# Patient Record
Sex: Male | Born: 1975 | Race: White | Hispanic: No | Marital: Married | Smoking: Never smoker
Health system: Southern US, Community
[De-identification: ages and names within clinical notes are randomized; demographics above are authoritative.]

## PROBLEM LIST (undated history)

## (undated) DIAGNOSIS — I10 Essential (primary) hypertension: Secondary | ICD-10-CM

## (undated) DIAGNOSIS — E7800 Pure hypercholesterolemia, unspecified: Secondary | ICD-10-CM

## (undated) HISTORY — PX: NO PAST SURGERIES: SHX2092

## (undated) HISTORY — DX: Essential (primary) hypertension: I10

---

## 2010-04-16 ENCOUNTER — Emergency Department (HOSPITAL_COMMUNITY): Admission: EM | Admit: 2010-04-16 | Discharge: 2010-04-16 | Payer: Self-pay | Admitting: Emergency Medicine

## 2010-04-28 ENCOUNTER — Emergency Department (HOSPITAL_COMMUNITY): Admission: EM | Admit: 2010-04-28 | Discharge: 2010-04-29 | Payer: Self-pay | Admitting: Internal Medicine

## 2011-03-19 LAB — CBC
Hemoglobin: 15.9 g/dL (ref 13.0–17.0)
MCHC: 35.1 g/dL (ref 30.0–36.0)
MCV: 92 fL (ref 78.0–100.0)
RBC: 4.94 MIL/uL (ref 4.22–5.81)
RDW: 12.7 % (ref 11.5–15.5)
WBC: 8.1 10*3/uL (ref 4.0–10.5)

## 2011-03-19 LAB — URINE MICROSCOPIC-ADD ON

## 2011-03-19 LAB — COMPREHENSIVE METABOLIC PANEL
Alkaline Phosphatase: 130 U/L — ABNORMAL HIGH (ref 39–117)
GFR calc Af Amer: >60 mL/min (ref >60)
Potassium: 4.1 mEq/L (ref 3.5–5.1)
Total Bilirubin: 1.5 mg/dL — ABNORMAL HIGH (ref 0.3–1.2)

## 2011-03-19 LAB — DIFFERENTIAL
Eosinophils Relative: 0 % (ref 0–5)
Lymphocytes Relative: 9 % — ABNORMAL LOW (ref 12–46)
Lymphs Abs: 0.7 10*3/uL (ref 0.7–4.0)
Neutro Abs: 6.4 10*3/uL (ref 1.7–7.7)
Neutrophils Relative %: 79 % — ABNORMAL HIGH (ref 43–77)

## 2011-03-19 LAB — URINALYSIS, ROUTINE W REFLEX MICROSCOPIC
Glucose, UA: >1000 mg/dL — AB
Protein, ur: NEGATIVE mg/dL
Specific Gravity, Urine: 1.028 (ref 1.005–1.030)
Urobilinogen, UA: 4 mg/dL — ABNORMAL HIGH (ref 0.0–1.0)

## 2016-02-20 ENCOUNTER — Emergency Department (HOSPITAL_COMMUNITY)
Admission: EM | Admit: 2016-02-20 | Discharge: 2016-02-20 | Disposition: A | Discharge status: Home / Self Care | Payer: BLUE CROSS/BLUE SHIELD | Attending: Emergency Medicine | Admitting: Emergency Medicine

## 2016-02-20 ENCOUNTER — Encounter (HOSPITAL_COMMUNITY): Payer: Self-pay

## 2016-02-20 DIAGNOSIS — K922 Gastrointestinal hemorrhage, unspecified: Secondary | ICD-10-CM | POA: Diagnosis present

## 2016-02-20 DIAGNOSIS — R197 Diarrhea, unspecified: Secondary | ICD-10-CM | POA: Diagnosis not present

## 2016-02-20 LAB — COMPREHENSIVE METABOLIC PANEL
ALT: 34 U/L (ref 17–63)
AST: 29 U/L (ref 15–41)
Albumin: 4.4 g/dL (ref 3.5–5.0)
Alkaline Phosphatase: 71 U/L (ref 38–126)
Anion gap: 7 (ref 5–15)
BUN: 19 mg/dL (ref 6–20)
CHLORIDE: 106 mmol/L (ref 101–111)
CO2: 29 mmol/L (ref 22–32)
CREATININE: 0.93 mg/dL (ref 0.61–1.24)
Calcium: 9 mg/dL (ref 8.9–10.3)
GFR calc non Af Amer: >60 mL/min (ref >60)
Glucose, Bld: 93 mg/dL (ref 65–99)
Potassium: 4.4 mmol/L (ref 3.5–5.1)
SODIUM: 142 mmol/L (ref 135–145)
Total Bilirubin: 0.7 mg/dL (ref 0.3–1.2)
Total Protein: 7.7 g/dL (ref 6.5–8.1)

## 2016-02-20 LAB — CBC WITH DIFFERENTIAL/PLATELET
Basophils Absolute: 0 10*3/uL (ref 0.0–0.1)
Basophils Relative: 0 %
EOS ABS: 0.2 10*3/uL (ref 0.0–0.7)
Eosinophils Relative: 2 %
HEMATOCRIT: 46.9 % (ref 39.0–52.0)
HEMOGLOBIN: 16.5 g/dL (ref 13.0–17.0)
LYMPHS ABS: 1.7 10*3/uL (ref 0.7–4.0)
LYMPHS PCT: 21 %
MCH: 31.6 pg (ref 26.0–34.0)
MCHC: 35.2 g/dL (ref 30.0–36.0)
MCV: 89.8 fL (ref 78.0–100.0)
MONOS PCT: 13 %
Monocytes Absolute: 1.1 10*3/uL — ABNORMAL HIGH (ref 0.1–1.0)
NEUTROS ABS: 5.2 10*3/uL (ref 1.7–7.7)
NEUTROS PCT: 64 %
PLATELETS: 259 10*3/uL (ref 150–400)
RBC: 5.22 MIL/uL (ref 4.22–5.81)
RDW: 12.5 % (ref 11.5–15.5)
WBC: 8.2 10*3/uL (ref 4.0–10.5)

## 2016-02-20 NOTE — ED Provider Notes (Signed)
CSN: [id number]     Arrival date & time 02/20/16  1459 History   First MD Initiated Contact with Patient 02/20/16 1820     Chief Complaint  Patient presents with  . GI Bleeding     Patient is a 40 y.o. male presenting with diarrhea. The history is provided by the patient and a significant other.  Diarrhea Quality:  Watery Severity:  Moderate Onset quality:  Gradual Number of episodes:  Mutliple Timing:  Intermittent Progression:  Unchanged Relieved by:  None tried Worsened by:  Nothing tried Associated symptoms: abdominal pain   Associated symptoms: no fever and no vomiting   Risk factors: no recent antibiotic use and no travel to endemic areas   pt with recent bout of diarrhea-  He reports multiple episodes that were watery/yellow He took pepto bismol last night, and today had an episode of diarrhea that was black, but no blood He has never had this before He does take NSAIDs on a regular basis  PMH - none Soc hx - no travel Social History  Substance Use Topics  . Smoking status: Never Smoker   . Smokeless tobacco: None  . Alcohol Use: Yes     Comment: occ    Review of Systems  Constitutional: Negative for fever.  Gastrointestinal: Positive for abdominal pain and diarrhea. Negative for vomiting.  All other systems reviewed and are negative.     Allergies  Review of patient's allergies indicates no known allergies.  Home Medications   Prior to Admission medications   Not on File   BP 140/98 mmHg  Pulse 84  Temp(Src) 97.8 F (36.6 C) (Oral)  Resp 16  Ht  (1.626 m)  Wt 86.183 kg  BMI 32.60 kg/m2  SpO2 100% Physical Exam CONSTITUTIONAL: Well developed/well nourished HEAD: Normocephalic/atraumatic EYES: EOMI/PERRL, conjunctiva pink, no icterus ENMT: Mucous membranes moist NECK: supple no meningeal signs SPINE/BACK:entire spine nontender CV: S1/S2 noted, no murmurs/rubs/gallops noted LUNGS: Lungs are clear to auscultation bilaterally, no apparent  distress ABDOMEN: soft, nontender, no rebound or guarding, bowel sounds noted throughout abdomen Rectal - pt declines NEURO: Pt is awake/alert/appropriate, moves all extremitiesx4.  No facial droop.   SKIN: warm, color normal PSYCH: no abnormalities of mood noted, alert and oriented to situation  ED Course  Procedures  Labs Review Labs Reviewed  CBC WITH DIFFERENTIAL/PLATELET - Abnormal; Notable for the following:    Monocytes Absolute 1.1 (*)    All other components within normal limits  COMPREHENSIVE METABOLIC PANEL    I have personally reviewed and evaluated these  lab results as part of my medical decision-making.   Pt with probable viral diarrhea Suspect dark stool from pepto He declines rectal exam PCP f/u next week advised if diarrhea continues MDM   Final diagnoses:  Diarrhea, unspecified type    Nursing notes including past medical history and social history reviewed and considered in documentation Labs/vital reviewed myself and considered during evaluation     Zadie Rhine, MD 02/20/16 1901

## 2016-02-20 NOTE — ED Notes (Signed)
Pt reports woke up Sunday morning and had several episodes of diarrhea.  Reports had some diarrhea yesterday and nauseated.  This morning he drank some coffee,  ate breakfast and immediately had to go to the restroom.  Pt says had another episode of diarrhea this morning but stool was black.  Pt says he took 30ml of pepto bismol last night.

## 2016-12-03 DIAGNOSIS — Z6833 Body mass index (BMI) 33.0-33.9, adult: Secondary | ICD-10-CM | POA: Diagnosis not present

## 2016-12-03 DIAGNOSIS — M25532 Pain in left wrist: Secondary | ICD-10-CM | POA: Diagnosis not present

## 2017-01-24 DIAGNOSIS — H9201 Otalgia, right ear: Secondary | ICD-10-CM | POA: Diagnosis not present

## 2017-12-11 DIAGNOSIS — Z6834 Body mass index (BMI) 34.0-34.9, adult: Secondary | ICD-10-CM | POA: Diagnosis not present

## 2017-12-11 DIAGNOSIS — J09X2 Influenza due to identified novel influenza A virus with other respiratory manifestations: Secondary | ICD-10-CM | POA: Diagnosis not present

## 2018-03-09 DIAGNOSIS — J019 Acute sinusitis, unspecified: Secondary | ICD-10-CM | POA: Diagnosis not present

## 2018-03-09 DIAGNOSIS — J069 Acute upper respiratory infection, unspecified: Secondary | ICD-10-CM | POA: Diagnosis not present

## 2018-03-09 DIAGNOSIS — Z681 Body mass index (BMI) 19 or less, adult: Secondary | ICD-10-CM | POA: Diagnosis not present

## 2018-10-18 ENCOUNTER — Emergency Department (HOSPITAL_COMMUNITY): Payer: BLUE CROSS/BLUE SHIELD

## 2018-10-18 ENCOUNTER — Encounter (HOSPITAL_COMMUNITY): Payer: Self-pay | Admitting: Emergency Medicine

## 2018-10-18 ENCOUNTER — Other Ambulatory Visit: Payer: Self-pay

## 2018-10-18 ENCOUNTER — Emergency Department (HOSPITAL_COMMUNITY)
Admission: EM | Admit: 2018-10-18 | Discharge: 2018-10-18 | Disposition: A | Discharge status: Home / Self Care | Payer: BLUE CROSS/BLUE SHIELD | Attending: Emergency Medicine | Admitting: Emergency Medicine

## 2018-10-18 DIAGNOSIS — E7800 Pure hypercholesterolemia, unspecified: Secondary | ICD-10-CM | POA: Diagnosis not present

## 2018-10-18 DIAGNOSIS — R072 Precordial pain: Secondary | ICD-10-CM | POA: Diagnosis not present

## 2018-10-18 DIAGNOSIS — R079 Chest pain, unspecified: Secondary | ICD-10-CM | POA: Diagnosis not present

## 2018-10-18 DIAGNOSIS — R0789 Other chest pain: Secondary | ICD-10-CM

## 2018-10-18 HISTORY — DX: Pure hypercholesterolemia, unspecified: E78.00

## 2018-10-18 LAB — CBC
HCT: 49.9 % (ref 39.0–52.0)
Hemoglobin: 16.6 g/dL (ref 13.0–17.0)
MCH: 30.3 pg (ref 26.0–34.0)
MCHC: 33.3 g/dL (ref 30.0–36.0)
MCV: 91.2 fL (ref 80.0–100.0)
PLATELETS: 314 10*3/uL (ref 150–400)
RBC: 5.47 MIL/uL (ref 4.22–5.81)
RDW: 12.2 % (ref 11.5–15.5)
WBC: 8.6 10*3/uL (ref 4.0–10.5)
nRBC: 0 % (ref 0.0–0.2)

## 2018-10-18 LAB — BASIC METABOLIC PANEL
ANION GAP: 6 (ref 5–15)
BUN: 14 mg/dL (ref 6–20)
CALCIUM: 9.4 mg/dL (ref 8.9–10.3)
CO2: 27 mmol/L (ref 22–32)
CREATININE: 0.94 mg/dL (ref 0.61–1.24)
Chloride: 107 mmol/L (ref 98–111)
GFR calc Af Amer: >60 mL/min (ref >60)
Glucose, Bld: 75 mg/dL (ref 70–99)
Potassium: 4.2 mmol/L (ref 3.5–5.1)
Sodium: 140 mmol/L (ref 135–145)

## 2018-10-18 LAB — TROPONIN I: Troponin I: <0.03 ng/mL (ref <0.03)

## 2018-10-18 NOTE — ED Triage Notes (Signed)
Patient c/o mid-sternal chest pain, non-radiating and intermittent. Patient states started after eating. Patient reports shortness of breath and feeling flushed. Denies any nausea, vomiting, dizziness, or back pain. Denies any cardiac hx.

## 2018-10-18 NOTE — Discharge Instructions (Signed)
Your testing today revealed no abnormal findings, your blood work was perfectly normal as was your EKG.  Please follow-up with the cardiologist if you continue to have intermittent pain but if it gets worse come back to the emergency department.  If you do not have a doctor see list below for local family doctors, follow-up this week  [facility] Doctor List    Kari Baars MD. Specialty: Pulmonary Disease Contact information: [address]  [address]  [place] [address]  [phone]   Syliva Overman, MD. Specialty: [facility] Contact information: [address]  [place] [address]  [phone]   Lilyan Punt, MD. Specialty: [facility] Contact information: [address]  [place] [address]  [phone]   Avon Gully, MD Specialty: Internal Medicine Contact information: [address] [place] [address]  [phone]   Catalina Pizza, MD. Specialty: Internal Medicine Contact information: [address]  [place] [address]  [phone]    [facility] (Dr. Selena Batten) Specialty: Family Medicine Contact information: [address]  [place] [address]  [phone]   John Giovanni, MD. Specialty: [facility] Contact information: [address]  [address]  [place] [address]  [phone]   Carylon Perches, MD. Specialty: Internal Medicine Contact information: [address]  [address]  [place] [address]  [phone]    [facility]  [address] [place], [place] [address] [phone]  Services [facility] offers a variety of basic health services.  Services include but are not limited to: Blood pressure checks  Heart rate checks  Blood sugar checks  Urine analysis  Rapid strep tests  Pregnancy tests.  Health education and referrals  People needing more complex services will be directed to a  physician online. Using these virtual visits, doctors can evaluate and prescribe medicine and treatments. There will be no medication on-site, though [facility] will help patients fill their prescriptions at little to no cost.   For More information please go to: [email or web address]

## 2018-10-18 NOTE — ED Provider Notes (Signed)
[facility] EMERGENCY DEPARTMENT Provider Note   CSN: [id number] Arrival date & time: 10/18/18  1208     History   Chief Complaint Chief Complaint  Patient presents with  . Chest Pain    HPI Vincent George is a 42 y.o. male.  HPI  42 year old male, he has no significant past medical history, states he has never been diagnosed with any medical problems and he takes no medications.  He does not smoke cigarettes and drinks only occasional alcohol.  His family history is negative for heart disease except for a grandfather who had heart disease in his mid to late 20s.  The patient reports that he works in a warehouse type setting, he exerts himself regularly at work, he does not have any frequent chest discomfort.  He reports that he woke up this morning feeling normal, they went to breakfast, he and his wife were sitting down eating breakfast when he had a discomfort in his chest with associated shortness of breath.  He excused himself from the table, went outside to get some fresh air, walked around and came back symptom-free.  Finished his meal, was driving in the car with his wife and the symptoms came back, similar in sensation, mid chest, the patient denies any shortness of breath swelling of the legs recent travel, trauma, immobilization, surgery or history of cancer.  He states this is occurred twice today, the symptoms last for 2 to 3 minutes and then resolved spontaneously, they are not exertional.  Past Medical History:  Diagnosis Date  . High cholesterol     There are no active problems to display for this patient.   History reviewed. No pertinent surgical history.      Home Medications    Prior to Admission medications   Medication Sig Start Date End Date Taking? Authorizing Provider  acetaminophen (TYLENOL) 500 MG tablet Take 1,000 mg by mouth every 6 (six) hours as needed for headache.   Yes [provider]    Family History No family history on  file.  Social History Social History   Tobacco Use  . Smoking status: Never Smoker  . Smokeless tobacco: Never Used  Substance Use Topics  . Alcohol use: Yes    Comment: occ  . Drug use: No     Allergies   Patient has no known allergies.   Review of Systems Review of Systems  Constitutional: Negative for chills and fever.  HENT: Negative for sore throat.   Eyes: Negative for visual disturbance.  Respiratory: Negative for cough and shortness of breath.   Cardiovascular: Positive for chest pain.  Gastrointestinal: Negative for abdominal pain, diarrhea, nausea and vomiting.  Genitourinary: Negative for dysuria and frequency.  Musculoskeletal: Negative for back pain and neck pain.  Skin: Negative for rash.  Neurological: Negative for weakness, numbness and headaches.  Hematological: Negative for adenopathy.  Psychiatric/Behavioral: Negative for behavioral problems.  All other systems reviewed and are negative.    Physical Exam Updated Vital Signs BP 125/84   Pulse 79   Temp 98.1 F (36.7 C) (Oral)   Resp (!) 23   Ht 1.626 m (5\' 4" )   Wt 81.6 kg   SpO2 98%   BMI 30.90 kg/m   Physical Exam  Constitutional: He appears well-developed and well-nourished. No distress.  HENT:  Head: Normocephalic and atraumatic.  Mouth/Throat: Oropharynx is clear and moist. No oropharyngeal exudate.  Eyes: Pupils are equal, round, and reactive to light. Conjunctivae and EOM are normal. Right eye  exhibits no discharge. Left eye exhibits no discharge. No scleral icterus.  Neck: Normal range of motion. Neck supple. No JVD present. No thyromegaly present.  Cardiovascular: Normal rate, regular rhythm, normal heart sounds and intact distal pulses. Exam reveals no gallop and no friction rub.  No murmur heard. Pulmonary/Chest: Effort normal and breath sounds normal. No respiratory distress. He has no wheezes. He has no rales.  Abdominal: Soft. Bowel sounds are normal. He exhibits no  distension and no mass. There is no tenderness.  Musculoskeletal: Normal range of motion. He exhibits no edema or tenderness.  Lymphadenopathy:    He has no cervical adenopathy.  Neurological: He is alert. Coordination normal.  Skin: Skin is warm and dry. No rash noted. No erythema.  Psychiatric: He has a normal mood and affect. His behavior is normal.  Nursing note and vitals reviewed.    ED Treatments / Results  Labs (all labs ordered are listed, but only abnormal results are displayed) Labs Reviewed  BASIC METABOLIC PANEL  CBC  TROPONIN I  TROPONIN I    EKG EKG Interpretation  Date/Time:  Sunday October 18 2018 12:18:04 EDT Ventricular Rate:  74 PR Interval:    QRS Duration: 91 QT Interval:  346 QTC Calculation: 384 R Axis:   22 Text Interpretation:  Sinus rhythm Normal ECG No old tracing to compare Confirmed by Eber Hong ([id number]) on 10/18/2018 12:32:41 PM   EKG Interpretation  Date/Time:  Sunday October 18 2018 15:22:57 EDT Ventricular Rate:  86 PR Interval:    QRS Duration: 92 QT Interval:  353 QTC Calculation: 423 R Axis:   29 Text Interpretation:  Sinus rhythm since last tracing no significant change Confirmed by Eber Hong ([id number]) on 10/18/2018 3:43:05 PM        Radiology Dg Chest 2 View  Result Date: 10/18/2018 CLINICAL DATA:  Chest pain this morning with some shortness-of-breath. EXAM: CHEST - 2 VIEW COMPARISON:  CT 04/29/2010 FINDINGS: Lungs are adequately inflated without consolidation or effusion. Cardiomediastinal silhouette is within normal. Mild anterior wedging of 2 adjacent lower thoracic vertebral bodies unchanged. IMPRESSION: No acute cardiopulmonary disease. Two adjacent mild compression fractures over the lower thoracic/upper lumbar spine unchanged. Electronically Signed   By: Elberta Fortis M.D.   On: 10/18/2018 13:20    Procedures Procedures (including critical care time)  Medications Ordered in ED Medications - No data to  display   Initial Impression / Assessment and Plan / ED Course  I have reviewed the triage vital signs and the nursing notes.  Pertinent labs & imaging results that were available during my care of the patient were reviewed by me and considered in my medical decision making (see chart for details).  Clinical Course as of Oct 18 1633  Wynelle Link Oct 18, 2018  1342 Initial troponin basic metabolic panel and CBC are totally normal.   [BM]  1342 The x-ray which I personally interpreted and reviewed shows no acute cardiopulmonary findings.   [BM]  1543 Repeat EKG shows no acute findings, no changes.  Second troponin pending   [BM]  1622 Second trop neg - pt will be d/c.   [BM]    Clinical Course User Index [BM] Eber Hong, MD    The only significant abnormality is that the patient's blood pressure was elevated, when I rechecked it it was 149/105.  The rest of his exam is normal and he is asymptomatic at this time.  Specifically his EKG is reassuring, it shows a rate of 74 and  a normal rhythm with no ST elevations or depressions.  Will obtain a troponin and a chest x-ray, will also obtain a second troponin III hours.  I anticipate that the patient can be discharged home if this is negative.  He has no fevers or shortness of breath or coughing to suggest an infectious etiology and is asymptomatic at this time making dissection or pulmonary embolism or pericarditis much less likely.  Final Clinical Impressions(s) / ED Diagnoses   Final diagnoses:  Midsternal chest pain    ED Discharge Orders    None       Eber Hong, MD 10/18/18 [phone]

## 2019-02-12 DIAGNOSIS — K429 Umbilical hernia without obstruction or gangrene: Secondary | ICD-10-CM | POA: Diagnosis not present

## 2019-02-12 DIAGNOSIS — Z6834 Body mass index (BMI) 34.0-34.9, adult: Secondary | ICD-10-CM | POA: Diagnosis not present

## 2019-02-12 DIAGNOSIS — S39011A Strain of muscle, fascia and tendon of abdomen, initial encounter: Secondary | ICD-10-CM | POA: Diagnosis not present

## 2019-03-03 DIAGNOSIS — Z6834 Body mass index (BMI) 34.0-34.9, adult: Secondary | ICD-10-CM | POA: Diagnosis not present

## 2019-03-03 DIAGNOSIS — R1011 Right upper quadrant pain: Secondary | ICD-10-CM | POA: Diagnosis not present

## 2019-03-04 ENCOUNTER — Other Ambulatory Visit (HOSPITAL_COMMUNITY): Payer: Self-pay | Admitting: Physician Assistant

## 2019-03-04 DIAGNOSIS — R1011 Right upper quadrant pain: Secondary | ICD-10-CM

## 2019-03-10 ENCOUNTER — Ambulatory Visit (HOSPITAL_COMMUNITY): Payer: BLUE CROSS/BLUE SHIELD

## 2019-03-10 ENCOUNTER — Encounter (HOSPITAL_COMMUNITY): Payer: Self-pay

## 2019-03-15 ENCOUNTER — Ambulatory Visit (HOSPITAL_COMMUNITY): Payer: BLUE CROSS/BLUE SHIELD

## 2019-03-19 ENCOUNTER — Ambulatory Visit (HOSPITAL_COMMUNITY): Payer: BLUE CROSS/BLUE SHIELD

## 2019-07-31 DIAGNOSIS — Z6836 Body mass index (BMI) 36.0-36.9, adult: Secondary | ICD-10-CM | POA: Diagnosis not present

## 2019-07-31 DIAGNOSIS — L0390 Cellulitis, unspecified: Secondary | ICD-10-CM | POA: Diagnosis not present

## 2020-02-02 DIAGNOSIS — Z20828 Contact with and (suspected) exposure to other viral communicable diseases: Secondary | ICD-10-CM | POA: Diagnosis not present

## 2020-06-05 DIAGNOSIS — Z20828 Contact with and (suspected) exposure to other viral communicable diseases: Secondary | ICD-10-CM | POA: Diagnosis not present

## 2020-06-29 DIAGNOSIS — R05 Cough: Secondary | ICD-10-CM | POA: Diagnosis not present

## 2020-06-29 DIAGNOSIS — J069 Acute upper respiratory infection, unspecified: Secondary | ICD-10-CM | POA: Diagnosis not present

## 2020-11-21 DIAGNOSIS — Z6836 Body mass index (BMI) 36.0-36.9, adult: Secondary | ICD-10-CM | POA: Diagnosis not present

## 2020-11-21 DIAGNOSIS — R5383 Other fatigue: Secondary | ICD-10-CM | POA: Diagnosis not present

## 2020-11-21 DIAGNOSIS — Z8616 Personal history of COVID-19: Secondary | ICD-10-CM | POA: Diagnosis not present

## 2021-12-19 ENCOUNTER — Encounter (HOSPITAL_COMMUNITY): Payer: Self-pay | Admitting: Emergency Medicine

## 2021-12-19 ENCOUNTER — Emergency Department (HOSPITAL_COMMUNITY): Payer: BC Managed Care – PPO

## 2021-12-19 ENCOUNTER — Emergency Department (HOSPITAL_COMMUNITY)
Admission: EM | Admit: 2021-12-19 | Discharge: 2021-12-19 | Disposition: A | Discharge status: Home / Self Care | Payer: BC Managed Care – PPO | Attending: Emergency Medicine | Admitting: Emergency Medicine

## 2021-12-19 ENCOUNTER — Other Ambulatory Visit: Payer: Self-pay

## 2021-12-19 DIAGNOSIS — R0789 Other chest pain: Secondary | ICD-10-CM | POA: Diagnosis present

## 2021-12-19 DIAGNOSIS — R079 Chest pain, unspecified: Secondary | ICD-10-CM

## 2021-12-19 DIAGNOSIS — I160 Hypertensive urgency: Secondary | ICD-10-CM | POA: Diagnosis not present

## 2021-12-19 LAB — CBC WITH DIFFERENTIAL/PLATELET
Abs Immature Granulocytes: 0.01 10*3/uL (ref 0.00–0.07)
Basophils Absolute: 0.1 10*3/uL (ref 0.0–0.1)
Basophils Relative: 1 %
Eosinophils Absolute: 0.2 10*3/uL (ref 0.0–0.5)
Eosinophils Relative: 2 %
HCT: 45.7 % (ref 39.0–52.0)
Hemoglobin: 16.2 g/dL (ref 13.0–17.0)
Immature Granulocytes: 0 %
Lymphocytes Relative: 28 %
Lymphs Abs: 2.3 10*3/uL (ref 0.7–4.0)
MCH: 32.3 pg (ref 26.0–34.0)
MCHC: 35.4 g/dL (ref 30.0–36.0)
MCV: 91 fL (ref 80.0–100.0)
Monocytes Absolute: 0.8 10*3/uL (ref 0.1–1.0)
Monocytes Relative: 10 %
Neutro Abs: 4.8 10*3/uL (ref 1.7–7.7)
Neutrophils Relative %: 59 %
Platelets: 306 10*3/uL (ref 150–400)
RBC: 5.02 MIL/uL (ref 4.22–5.81)
RDW: 12.3 % (ref 11.5–15.5)
WBC: 8.1 10*3/uL (ref 4.0–10.5)
nRBC: 0 % (ref 0.0–0.2)

## 2021-12-19 LAB — COMPREHENSIVE METABOLIC PANEL
ALT: 41 U/L (ref 0–44)
AST: 29 U/L (ref 15–41)
Albumin: 4.2 g/dL (ref 3.5–5.0)
Alkaline Phosphatase: 79 U/L (ref 38–126)
Anion gap: 9 (ref 5–15)
BUN: 15 mg/dL (ref 6–20)
CO2: 25 mmol/L (ref 22–32)
Calcium: 9 mg/dL (ref 8.9–10.3)
Chloride: 104 mmol/L (ref 98–111)
Creatinine, Ser: 0.99 mg/dL (ref 0.61–1.24)
GFR, Estimated: >60 mL/min (ref >60)
Glucose, Bld: 98 mg/dL (ref 70–99)
Potassium: 3.8 mmol/L (ref 3.5–5.1)
Sodium: 138 mmol/L (ref 135–145)
Total Bilirubin: 0.5 mg/dL (ref 0.3–1.2)
Total Protein: 7.3 g/dL (ref 6.5–8.1)

## 2021-12-19 LAB — MAGNESIUM: Magnesium: 1.8 mg/dL (ref 1.7–2.4)

## 2021-12-19 LAB — CBG MONITORING, ED: Glucose-Capillary: 90 mg/dL (ref 70–99)

## 2021-12-19 LAB — BRAIN NATRIURETIC PEPTIDE: B Natriuretic Peptide: 13 pg/mL (ref 0.0–100.0)

## 2021-12-19 LAB — TROPONIN I (HIGH SENSITIVITY)
Troponin I (High Sensitivity): 3 ng/L (ref <18)
Troponin I (High Sensitivity): 3 ng/L (ref <18)

## 2021-12-19 LAB — PROTIME-INR
INR: 1 (ref 0.8–1.2)
Prothrombin Time: 12.9 seconds (ref 11.4–15.2)

## 2021-12-19 MED ORDER — HYDROCHLOROTHIAZIDE 25 MG PO TABS: 25.0000 mg | ORAL_TABLET | Freq: Every day | ORAL | 0 refills | Status: DC

## 2021-12-19 MED ORDER — IOHEXOL 350 MG/ML SOLN
100.0000 mL | Freq: Once | INTRAVENOUS | Status: AC | PRN
  Administered 2021-12-19: 10:00:00 100 mL via INTRAVENOUS

## 2021-12-19 MED ORDER — ASPIRIN 81 MG PO CHEW
324.0000 mg | CHEWABLE_TABLET | Freq: Once | ORAL | Status: AC
  Administered 2021-12-19: 08:00:00 324 mg via ORAL
  Filled 2021-12-19: qty 4

## 2021-12-19 NOTE — Discharge Instructions (Signed)
As discussed, your evaluation today has been largely reassuring.  But, it is important that you monitor your condition carefully, and do not hesitate to return to the ED if you develop new, or concerning changes in your condition. ? ?Otherwise, please follow-up with your physician for appropriate ongoing care. ? ?

## 2021-12-19 NOTE — ED Notes (Signed)
MD at bedside for MSE

## 2021-12-19 NOTE — ED Provider Notes (Signed)
[facility] EMERGENCY DEPARTMENT Provider Note   CSN: [id number] Arrival date & time: 12/19/21  0757     History Chief Complaint  Patient presents with   Chest Pain    Vincent George is a 45 y.o. male.  HPI Patient presents with his mother.  He was well until yesterday.  Since yesterday he has developed chest pain, tight, sternal, radiating to the back.  No weakness in his extremities, some associated dyspnea, no syncope.  No fever, cough.  Patient denies history of hypertension, though this is listed on his medical chart.  He takes no medication regularly, does not smoke.  Since onset no relief with Tylenol, and today symptoms have become worse.    Past Medical History:  Diagnosis Date   High cholesterol    Hypertension     There are no problems to display for this patient.   History reviewed. No pertinent surgical history.     History reviewed. No pertinent family history.  Social History   Tobacco Use   Smoking status: Never   Smokeless tobacco: Never  Substance Use Topics   Alcohol use: Yes    Comment: occ   Drug use: No    Home Medications Prior to Admission medications   Medication Sig Start Date End Date Taking? Authorizing Provider  acetaminophen (TYLENOL) 500 MG tablet Take 1,000 mg by mouth every 6 (six) hours as needed for headache.   Yes [provider]    Allergies    Patient has no known allergies.  Review of Systems   Review of Systems  Constitutional:        Per HPI, otherwise negative  HENT:         Per HPI, otherwise negative  Respiratory:         Per HPI, otherwise negative  Cardiovascular:        Per HPI, otherwise negative  Gastrointestinal:  Negative for vomiting.  Endocrine:       Negative aside from HPI  Genitourinary:        Neg aside from HPI   Musculoskeletal:        Per HPI, otherwise negative  Skin: Negative.   Neurological:  Negative for syncope.   Physical Exam Updated Vital Signs BP (!) 157/99 (BP  Location: Left Arm)    Pulse 78    Temp 97.9 F (36.6 C) (Oral)    Resp 16    Ht 5\' 4"  (1.626 m)    Wt 99.8 kg    SpO2 100%    BMI 37.76 kg/m   Physical Exam Vitals and nursing note reviewed.  Constitutional:      General: He is not in acute distress.    Appearance: He is well-developed. He is obese. He is ill-appearing.  HENT:     Head: Normocephalic and atraumatic.  Eyes:     Conjunctiva/sclera: Conjunctivae normal.  Cardiovascular:     Rate and Rhythm: Normal rate and regular rhythm.  Pulmonary:     Effort: Pulmonary effort is normal. No respiratory distress.     Breath sounds: No stridor.  Abdominal:     General: There is no distension.  Skin:    General: Skin is warm and dry.  Neurological:     Mental Status: He is alert and oriented to person, place, and time.    ED Results / Procedures / Treatments   Labs (all labs ordered are listed, but only abnormal results are displayed) Labs Reviewed  COMPREHENSIVE METABOLIC PANEL  MAGNESIUM  BRAIN NATRIURETIC PEPTIDE  CBC WITH DIFFERENTIAL/PLATELET  PROTIME-INR  CBG MONITORING, ED  TROPONIN I (HIGH SENSITIVITY)  TROPONIN I (HIGH SENSITIVITY)    EKG EKG Interpretation  Date/Time:  Wednesday December 19 2021 08:20:26 EST Ventricular Rate:  78 PR Interval:  172 QRS Duration: 89 QT Interval:  365 QTC Calculation: 416 R Axis:   -23 Text Interpretation: Sinus rhythm Borderline left axis deviation Borderline ECG Confirmed by Gerhard Munch [phone]) on 12/19/2021 8:31:48 AM  Radiology DG Chest Portable 1 View  Result Date: 12/19/2021 CLINICAL DATA:  Chest pain EXAM: PORTABLE CHEST 1 VIEW COMPARISON:  Chest x-ray dated October 18, 2018 FINDINGS: The heart size and mediastinal contours are within normal limits. Both lungs are clear. The visualized skeletal structures are unremarkable. IMPRESSION: No active disease. Electronically Signed   By: Allegra Lai M.D.   On: 12/19/2021 08:31   CT ANGIO CHEST AORTA W/CM &/OR  WO/CM  Result Date: 12/19/2021 CLINICAL DATA:  Chest and back pain EXAM: CT ANGIOGRAPHY CHEST WITH CONTRAST TECHNIQUE: Multidetector CT imaging of the chest was performed using the standard protocol during bolus administration of intravenous contrast. Multiplanar CT image reconstructions and MIPs were obtained to evaluate the vascular anatomy. CONTRAST:  OMNIPAQUE IOHEXOL 350 MG/ML SOLN COMPARISON:  Chest x-ray earlier the same day FINDINGS: Cardiovascular: Heart size is normal. No pericardial effusion identified. Main pulmonary artery is normal caliber. Thoracic aorta is mildly tortuous and normal caliber without significant atherosclerotic disease. No thoracic aortic dissection identified. Mediastinum/Nodes: No bulky axillary, hilar or mediastinal lymphadenopathy identified. Lungs/Pleura: Lungs are clear. No pleural effusion or pneumothorax. Upper Abdomen: No acute abnormality. Musculoskeletal: No chest wall abnormality. No acute or significant osseous findings. Review of the MIP images confirms the above findings. IMPRESSION: No acute aortic syndrome or other acute intrathoracic process identified. Electronically Signed   By: Jannifer Hick M.D.   On: 12/19/2021 10:11    Procedures Procedures   Medications Ordered in ED Medications  aspirin chewable tablet 324 mg (324 mg Oral Given 12/19/21 0826)  iohexol (OMNIPAQUE) 350 MG/ML injection 100 mL (100 mLs Intravenous Contrast Given 12/19/21 9604)    ED Course  I have reviewed the triage vital signs and the nursing notes.  Pertinent labs & imaging results that were available during my care of the patient were reviewed by me and considered in my medical decision making (see chart for details).  Cardiac 80s sinus normal Pulse ox 99% room air normal 11:59 AM Patient in no distress.  Blood pressure substantially better, now with diastolic 20% lower than on arrival. I discussed all findings at length with him and his mother.  She more than  he seems to recall that the patient has previously been told about high blood pressure.  He reiterates that he is not currently taking any medication.  Here no evidence for ACS, with reassuring 2 normal troponin, nonischemic EKG, CT angiography without evidence for aortic dissection, no evidence for pneumonia, and he has improved clinically as well. Patient discharged in stable condition with ongoing meds for hypertensive urgency, primary care follow-up.  Final Clinical Impression(s) / ED Diagnoses Final diagnoses:  Chest pain  Atypical chest pain  Hypertensive urgency    Rx / DC Orders ED Discharge Orders          Ordered    hydrochlorothiazide (HYDRODIURIL) 25 MG tablet  Daily        12/19/21 1200             Gerhard Munch, MD  12/19/21 1201 ° °

## 2021-12-19 NOTE — ED Triage Notes (Signed)
Pt c/o chest pain that radiates to shoulder and center of back for 36 hours. No N&V or SOB.

## 2022-06-26 ENCOUNTER — Encounter: Payer: Self-pay | Admitting: *Deleted

## 2022-06-27 ENCOUNTER — Encounter: Payer: Self-pay | Admitting: Cardiology

## 2022-06-27 ENCOUNTER — Ambulatory Visit: Payer: BC Managed Care – PPO | Admitting: Cardiology

## 2022-06-27 VITALS — BP 142/106 | HR 87 | Ht 64.0 in | Wt 230.8 lb

## 2022-06-27 DIAGNOSIS — R9431 Abnormal electrocardiogram [ECG] [EKG]: Secondary | ICD-10-CM | POA: Diagnosis not present

## 2022-06-27 DIAGNOSIS — I10 Essential (primary) hypertension: Secondary | ICD-10-CM

## 2022-06-27 DIAGNOSIS — Z87898 Personal history of other specified conditions: Secondary | ICD-10-CM

## 2022-06-27 MED ORDER — LOSARTAN POTASSIUM 25 MG PO TABS: 25.0000 mg | ORAL_TABLET | Freq: Every day | ORAL | 5 refills | Status: DC

## 2022-06-27 MED ORDER — HYDROCHLOROTHIAZIDE 12.5 MG PO TABS: 12.5000 mg | ORAL_TABLET | Freq: Every day | ORAL | 5 refills | Status: DC

## 2022-06-27 NOTE — Progress Notes (Signed)
Cardiology Office Note  Date: 06/27/2022   ID: Vincent George, DOB 1976-08-10, MRN [id number]  PCP:  [facility]  Cardiologist:  Nona Dell, MD Electrophysiologist:  None   Chief Complaint  Patient presents with   Referred with abnormal ECG    History of Present Illness: Vincent George is a 46 y.o. male referred for cardiology consultation by Ms. McGee PA-C with [facility] for evaluation of abnormal ECG.  He has been treated for hypertension since December 2022, has been on HCTZ since that time.  He was actually seen in the ER at [facility] with shoulder discomfort and diaphoresis around the time of his blood pressure elevation.  No evidence of ACS by lab work.  Chest CT showed no significant atherosclerotic disease or acute process.  In the last for 5 weeks he had an episode of diaphoresis at work and significant elevated blood pressure again.  His medical regimen has not changed.  Generally, does not report exertional chest pain but some shortness of breath and fatigue. He works in Production designer, theatre/television/film at [employer].  He does have heart disease in both sets of grandparents, although not technically premature.  He reports compliance with his medications.  He has not undergone any prior ischemic testing.  Past Medical History:  Diagnosis Date   Essential hypertension     Past Surgical History:  Procedure Laterality Date   NO PAST SURGERIES      Current Outpatient Medications  Medication Sig Dispense Refill   acetaminophen (TYLENOL) 500 MG tablet Take 1,000 mg by mouth every 6 (six) hours as needed for headache.     aspirin EC 81 MG tablet Take 81 mg by mouth daily. Swallow whole.     losartan (COZAAR) 25 MG tablet Take 1 tablet (25 mg total) by mouth daily. 30 tablet 5   hydrochlorothiazide (HYDRODIURIL) 12.5 MG tablet Take 1 tablet (12.5 mg total) by mouth daily. 30 tablet 5   No current facility-administered medications for this visit.   Allergies:  Prednisone   ROS:  No palpitations or unexplained syncope.  Physical Exam: VS:  BP (!) 142/106   Pulse 87   Ht 5\' 4"  (1.626 m)   Wt 230 lb 12.8 oz (104.7 kg)   SpO2 97%   BMI 39.62 kg/m , BMI Body mass index is 39.62 kg/m.  Wt Readings from Last 3 Encounters:  06/27/22 230 lb 12.8 oz (104.7 kg)  12/19/21 220 lb (99.8 kg)  10/18/18 180 lb (81.6 kg)    General: Patient appears comfortable at rest. HEENT: Conjunctiva and lids normal, oropharynx clear. Neck: Supple, no elevated JVP or carotid bruits, no thyromegaly. Lungs: Clear to auscultation, nonlabored breathing at rest. Cardiac: Regular rate and rhythm, no S3 or significant systolic murmur, no pericardial rub. Abdomen: Soft, nontender, bowel sounds present. Extremities: No pitting edema, distal pulses 2+. Skin: Warm and dry. Musculoskeletal: No kyphosis. Neuropsychiatric: Alert and oriented x3, affect grossly appropriate.  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  Recent Labwork: 12/19/2021: ALT 41; AST 29; B Natriuretic Peptide 13.0; BUN 15; Creatinine, Ser 0.99; Hemoglobin 16.2; Magnesium 1.8; Platelets 306; Potassium 3.8; Sodium 138   Other Studies Reviewed Today:  Chest CT 12/19/2021: FINDINGS: Cardiovascular: Heart size is normal. No pericardial effusion identified. Main pulmonary artery is normal caliber. Thoracic aorta is mildly tortuous and normal caliber without significant atherosclerotic disease. No thoracic aortic dissection identified.   Mediastinum/Nodes: No  bulky axillary, hilar or mediastinal lymphadenopathy identified.   Lungs/Pleura: Lungs are clear. No pleural effusion or pneumothorax.   Upper Abdomen: No acute abnormality.   Musculoskeletal: No chest wall abnormality. No acute or significant osseous findings.   Review of the MIP images confirms the above findings.   IMPRESSION: No acute aortic syndrome or other  acute intrathoracic process identified.  Assessment and Plan:  1.  History of recurrent thoracic discomfort in the setting of hypertension, also some diaphoresis.  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  Does have some family history in both sets of grandparents, although not technically premature CAD.  I am not certain about his lipid status, but he does not report any history of type 2 diabetes mellitus.  Plan at this time is to obtain an exercise Myoview for ischemic evaluation.  2.  Essential hypertension, blood pressure is not optimally controlled.  I talked with him about weight loss and sodium restriction.  Doubt that HCTZ alone is going to be effective given current blood pressure range.  We will start Cozaar 25 mg daily, reduce HCTZ to 12.5 mg daily.  Follow-up BMET in 2 weeks.  Can uptitrate from there.  Medication Adjustments/Labs and Tests Ordered: Current medicines are reviewed at length with the patient today.  Concerns regarding medicines are outlined above.   Tests Ordered: Orders Placed This Encounter  Procedures   NM Myocar Multi W/Spect W/Wall Motion / EF   Basic metabolic panel    Medication Changes: Meds ordered this encounter  Medications   hydrochlorothiazide (HYDRODIURIL) 12.5 MG tablet    Sig: Take 1 tablet (12.5 mg total) by mouth daily.    Dispense:  30 tablet    Refill:  5    06/27/22 Dose decrease   losartan (COZAAR) 25 MG tablet    Sig: Take 1 tablet (25 mg total) by mouth daily.    Dispense:  30 tablet    Refill:  5    06/27/22 New Start    Disposition:  Follow up  test results.  Signed, Jonelle Sidle, MD, [facility] 06/27/2022 3:38 PM    [facility] [address] [place], [place], [place] [address] Phone: [phone]; Fax: [phone]

## 2022-06-27 NOTE — Patient Instructions (Signed)
Medication Instructions:  Your physician has recommended you make the following change in your medication:  Decrease hctz to 12.5 mg once a day Start cozaar 25 mg once a day Continue all other medications as directed  Labwork: Bmet (2 weeks at [facility])  Testing/Procedures: Your physician has requested that you have en exercise stress myoview. For further information please visit [email or web address] Please follow instruction sheet, as given.   Follow-Up: Your physician recommends that you schedule a follow-up appointment in: Follow Up Pending  Any Other Special Instructions Will Be Listed Below (If Applicable).  If you need a refill on your cardiac medications before your next appointment, please call your pharmacy.

## 2022-07-08 ENCOUNTER — Other Ambulatory Visit (HOSPITAL_COMMUNITY)
Admission: RE | Admit: 2022-07-08 | Discharge: 2022-07-08 | Disposition: A | Discharge status: Home / Self Care | Payer: BC Managed Care – PPO | Source: Ambulatory Visit | Attending: Cardiology | Admitting: Cardiology

## 2022-07-08 DIAGNOSIS — R9431 Abnormal electrocardiogram [ECG] [EKG]: Secondary | ICD-10-CM | POA: Diagnosis present

## 2022-07-08 LAB — BASIC METABOLIC PANEL
Anion gap: 8 (ref 5–15)
BUN: 18 mg/dL (ref 6–20)
CO2: 26 mmol/L (ref 22–32)
Calcium: 9.3 mg/dL (ref 8.9–10.3)
Chloride: 101 mmol/L (ref 98–111)
Creatinine, Ser: 1.08 mg/dL (ref 0.61–1.24)
GFR, Estimated: >60 mL/min (ref >60)
Glucose, Bld: 92 mg/dL (ref 70–99)
Potassium: 4.1 mmol/L (ref 3.5–5.1)
Sodium: 135 mmol/L (ref 135–145)

## 2022-12-20 ENCOUNTER — Telehealth: Payer: Self-pay | Admitting: Cardiology

## 2022-12-20 MED ORDER — LOSARTAN POTASSIUM 25 MG PO TABS: 25.0000 mg | ORAL_TABLET | Freq: Every day | ORAL | 0 refills | Status: DC

## 2022-12-20 MED ORDER — HYDROCHLOROTHIAZIDE 12.5 MG PO TABS: 12.5000 mg | ORAL_TABLET | Freq: Every day | ORAL | 0 refills | Status: DC

## 2022-12-20 NOTE — Telephone Encounter (Signed)
Done

## 2022-12-20 NOTE — Telephone Encounter (Signed)
*  STAT* If patient is at the pharmacy, call can be transferred to refill team.   1. Which medications need to be refilled? (please list name of each medication and dose if known)   hydrochlorothiazide (HYDRODIURIL) 12.5 MG tablet Take 1 tablet (12.5 mg total) by mouth daily.   losartan (COZAAR) 25 MG tablet Take 1 tablet (25 mg total) by mouth daily.     2. Which pharmacy/location (including street and city if local pharmacy) is medication to be sent to?   [id number] - [place], [place] - [address]    3. Do they need a 30 day or 90 day supply? 90

## 2023-03-03 ENCOUNTER — Other Ambulatory Visit: Payer: Self-pay | Admitting: Cardiology

## 2023-06-04 ENCOUNTER — Other Ambulatory Visit: Payer: Self-pay | Admitting: Cardiology

## 2023-08-06 IMAGING — DX DG CHEST 1V PORT
1 series · 1 of 1 positions shown · non-contrast
Comparison: Chest x-ray dated October 18, 2018

CLINICAL DATA: Chest pain

EXAM:
PORTABLE CHEST 1 VIEW

[chest ap]
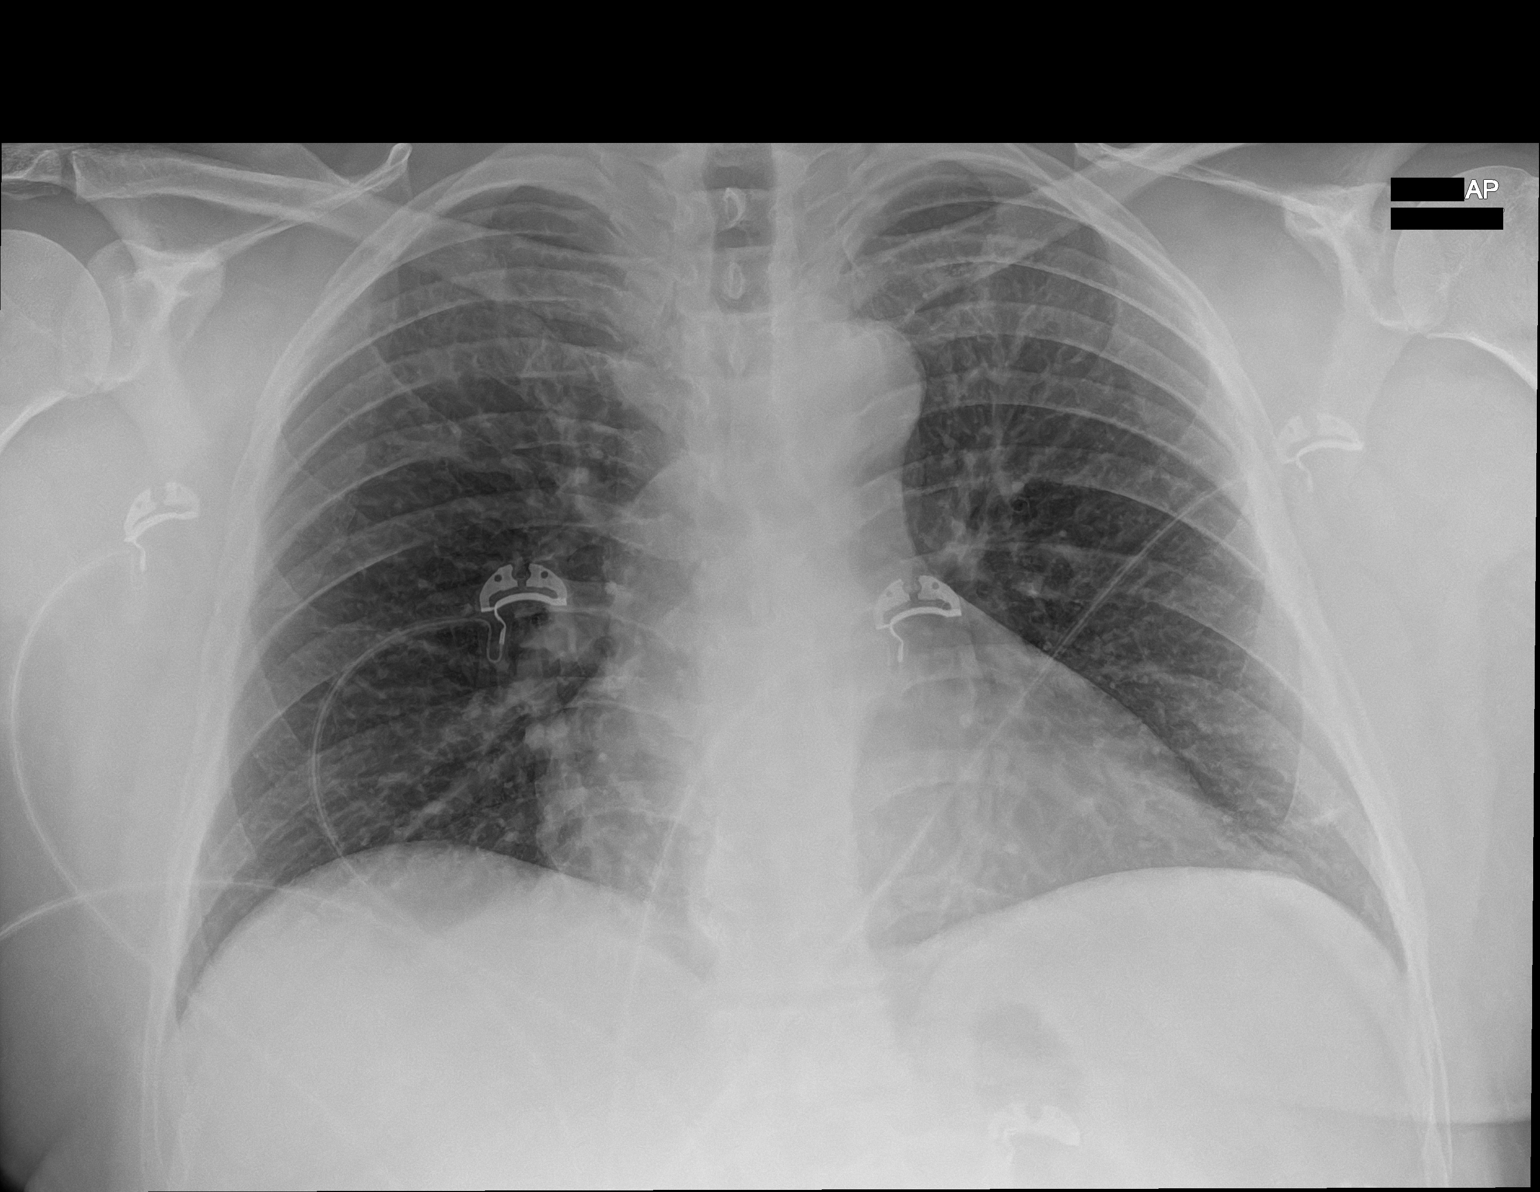

[1 of 1 positions shown; findings below may reference images not displayed]

FINDINGS: The heart size and mediastinal contours are within normal limits.
Both lungs are clear. The visualized skeletal structures are
unremarkable.
IMPRESSION: No active disease.

## 2023-08-06 IMAGING — CT CT ANGIO CHEST
2 of 6 series · 16 of 36 positions shown · IV contrast (Omnipaque or Isovue)
Comparison: Chest x-ray earlier the same day

CLINICAL DATA: Chest and back pain

EXAM:
CT ANGIOGRAPHY CHEST WITH CONTRAST
TECHNIQUE: Multidetector CT imaging of the chest was performed using the
standard protocol during bolus administration of intravenous
contrast. Multiplanar CT image reconstructions and MIPs were
obtained to evaluate the vascular anatomy.
CONTRAST:  100mL OMNIPAQUE IOHEXOL 350 MG/ML SOLN

[Series 9: lungs · axial · 0.78mm/px · z∈[+1153,+1407]mm · 15 of 147 slices shown]
[im 10/147  lung]
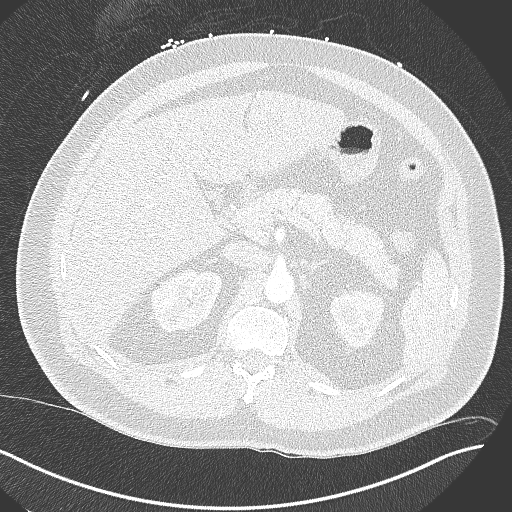
[im 19/147  mediastinal]
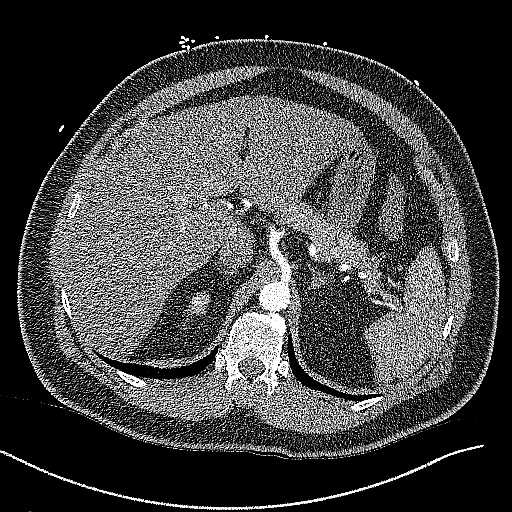
[im 28/147  lung]
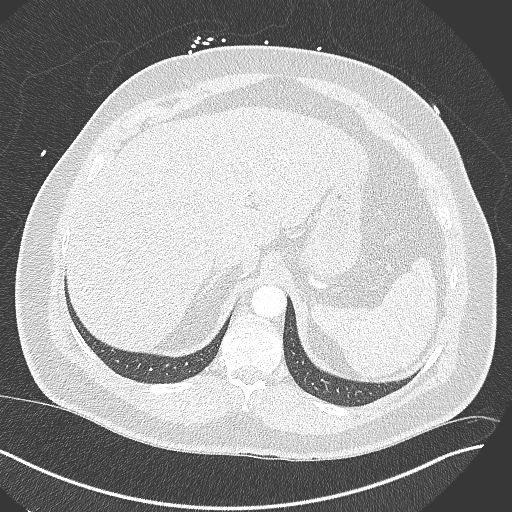
[im 37/147  mediastinal]
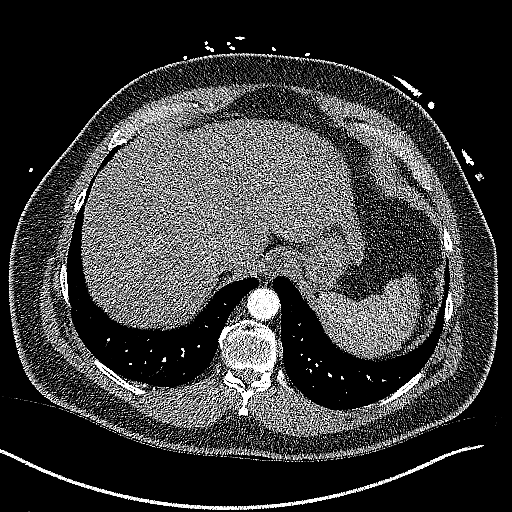
[im 46/147  lung]
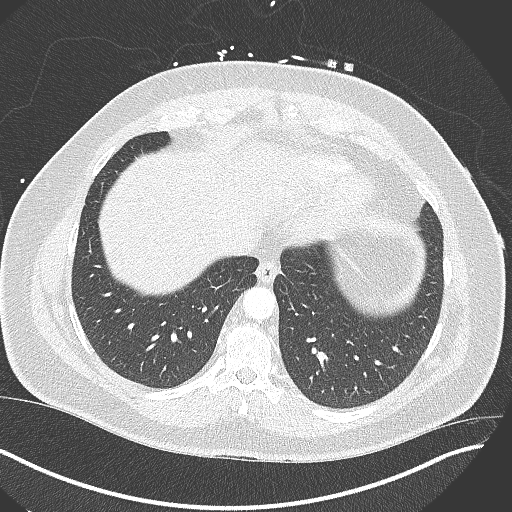
[im 55/147  mediastinal]
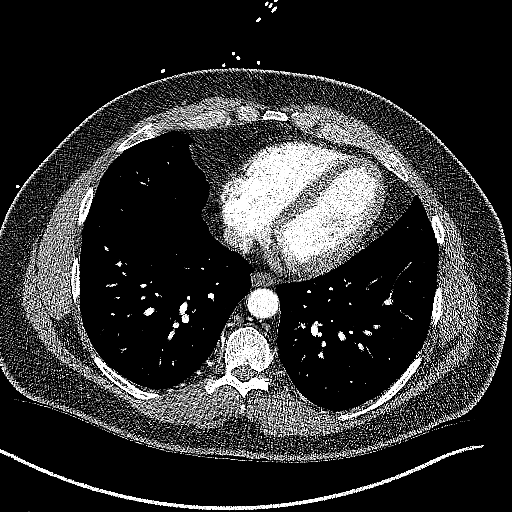
[im 64/147  lung]
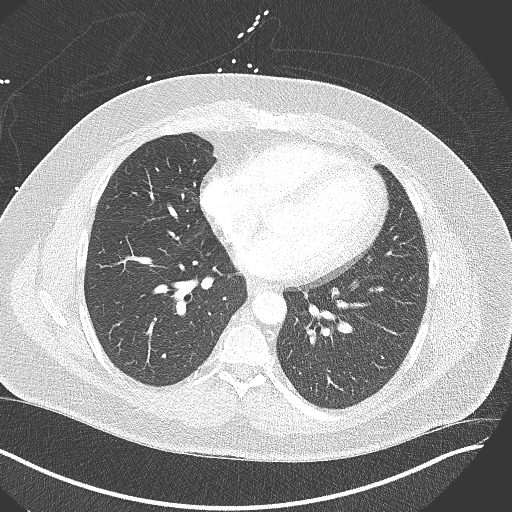
[im 74/147  mediastinal]
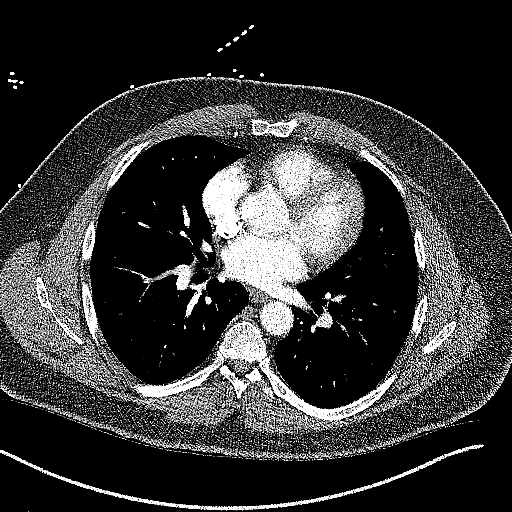
[im 83/147  lung]
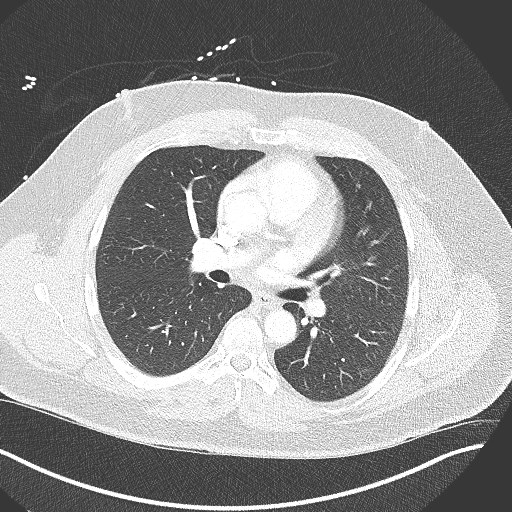
[im 92/147  mediastinal]
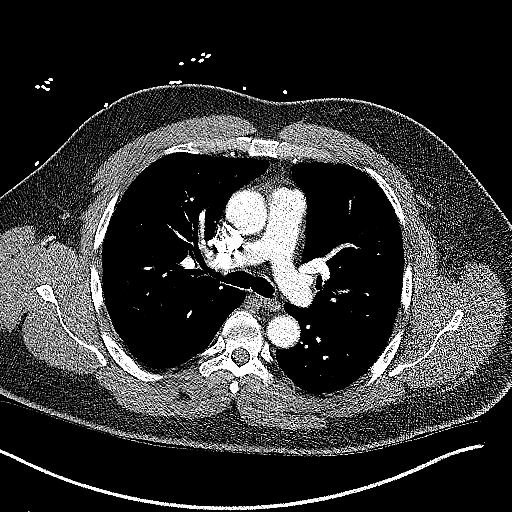
[im 101/147  lung]
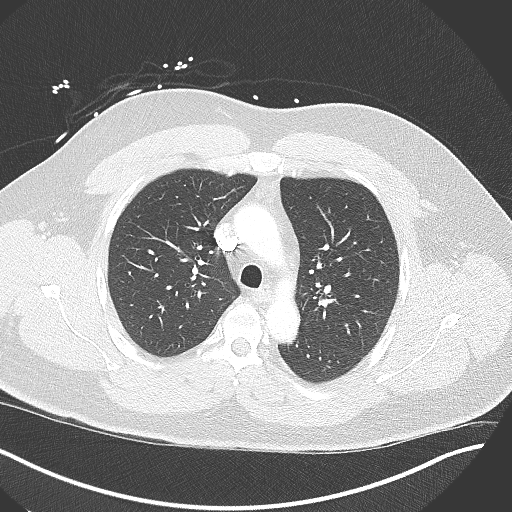
[im 110/147  mediastinal]
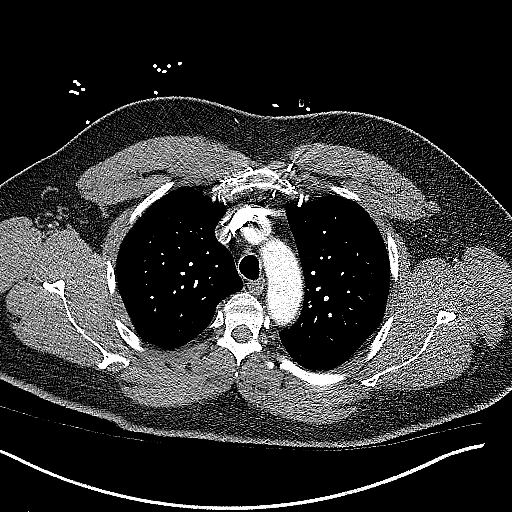
[im 119/147  lung]
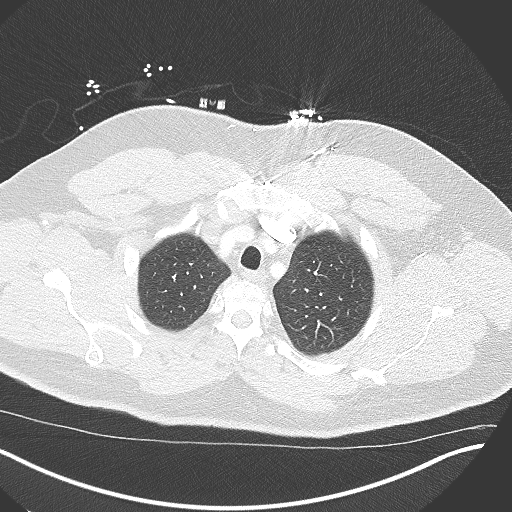
[im 128/147  mediastinal]
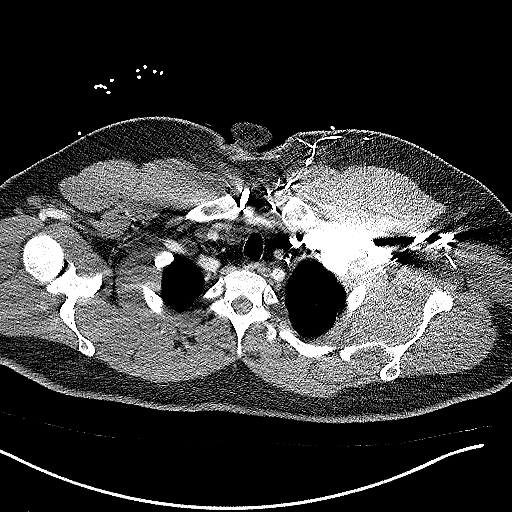
[im 137/147  lung]
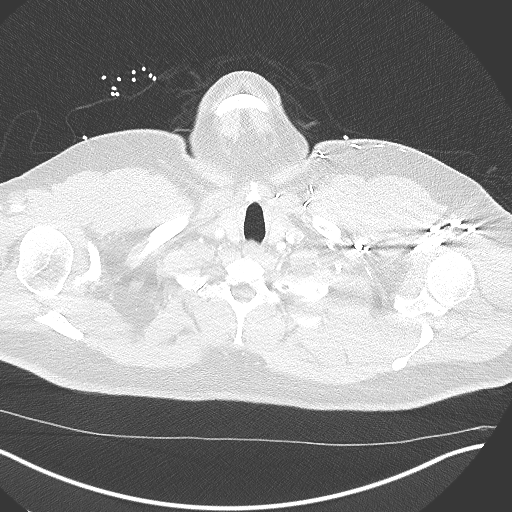

[Series 10: cor soft · coronal · 0.59mm/px · 1 of 174 slices shown]
[im 87/174  mediastinal]
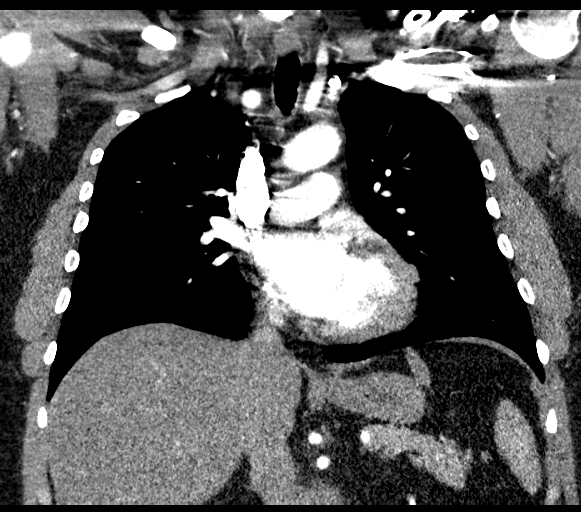

[16 of 36 positions shown; findings below may reference images not displayed]

FINDINGS: Cardiovascular: Heart size is normal. No pericardial effusion
identified. Main pulmonary artery is normal caliber. Thoracic aorta
is mildly tortuous and normal caliber without significant
atherosclerotic disease. No thoracic aortic dissection identified.

Mediastinum/Nodes: No bulky axillary, hilar or mediastinal
lymphadenopathy identified.

Lungs/Pleura: Lungs are clear. No pleural effusion or pneumothorax.

Upper Abdomen: No acute abnormality.

Musculoskeletal: No chest wall abnormality. No acute or significant
osseous findings.

Review of the MIP images confirms the above findings.
IMPRESSION: No acute aortic syndrome or other acute intrathoracic process
identified.

## 2023-08-11 ENCOUNTER — Other Ambulatory Visit (HOSPITAL_COMMUNITY): Payer: Self-pay

## 2023-08-11 MED ORDER — SEMAGLUTIDE-WEIGHT MANAGEMENT 0.25 MG/0.5ML ~~LOC~~ SOAJ
0.2500 mg | SUBCUTANEOUS | 0 refills | Status: DC
  Filled 2023-08-11: qty 2, 28d supply, fill #0

## 2023-08-12 ENCOUNTER — Other Ambulatory Visit (HOSPITAL_COMMUNITY): Payer: Self-pay

## 2023-09-05 ENCOUNTER — Other Ambulatory Visit (HOSPITAL_COMMUNITY): Payer: Self-pay

## 2023-09-05 ENCOUNTER — Other Ambulatory Visit (HOSPITAL_BASED_OUTPATIENT_CLINIC_OR_DEPARTMENT_OTHER): Payer: Self-pay

## 2023-09-05 MED ORDER — WEGOVY 0.5 MG/0.5ML ~~LOC~~ SOAJ
0.5000 mg | SUBCUTANEOUS | 0 refills | Status: DC
  Filled 2023-09-05: qty 2, 28d supply, fill #0

## 2023-09-05 MED ORDER — WEGOVY 0.25 MG/0.5ML ~~LOC~~ SOAJ
0.2500 mg | SUBCUTANEOUS | 3 refills | Status: DC
  Filled 2023-09-05: qty 2, 28d supply, fill #0

## 2023-09-29 ENCOUNTER — Other Ambulatory Visit (HOSPITAL_COMMUNITY): Payer: Self-pay

## 2023-09-30 ENCOUNTER — Other Ambulatory Visit (HOSPITAL_COMMUNITY): Payer: Self-pay

## 2023-09-30 MED ORDER — SEMAGLUTIDE-WEIGHT MANAGEMENT 0.5 MG/0.5ML ~~LOC~~ SOAJ
0.5000 mg | SUBCUTANEOUS | 0 refills | Status: DC
  Filled 2023-09-30: qty 2, 28d supply, fill #0

## 2023-11-03 ENCOUNTER — Other Ambulatory Visit (HOSPITAL_COMMUNITY): Payer: Self-pay

## 2023-11-05 ENCOUNTER — Other Ambulatory Visit (HOSPITAL_COMMUNITY): Payer: Self-pay

## 2023-11-05 MED ORDER — SEMAGLUTIDE-WEIGHT MANAGEMENT 0.5 MG/0.5ML ~~LOC~~ SOAJ
0.5000 mg | SUBCUTANEOUS | 0 refills | Status: DC
  Filled 2023-11-05: qty 2, 28d supply, fill #0

## 2024-01-01 ENCOUNTER — Other Ambulatory Visit (HOSPITAL_COMMUNITY): Payer: Self-pay

## 2024-01-01 MED ORDER — WEGOVY 0.5 MG/0.5ML ~~LOC~~ SOAJ
0.5000 mg | SUBCUTANEOUS | 0 refills | Status: DC
  Filled 2024-01-01 – 2024-02-24 (×2): qty 2, 28d supply, fill #0

## 2024-01-12 ENCOUNTER — Other Ambulatory Visit (HOSPITAL_COMMUNITY): Payer: Self-pay

## 2024-01-13 ENCOUNTER — Other Ambulatory Visit (HOSPITAL_COMMUNITY): Payer: Self-pay

## 2024-01-13 MED ORDER — WEGOVY 0.25 MG/0.5ML ~~LOC~~ SOAJ
0.2500 mg | SUBCUTANEOUS | 0 refills | Status: DC
  Filled 2024-01-13 – 2024-01-27 (×2): qty 2, 28d supply, fill #0

## 2024-01-15 ENCOUNTER — Other Ambulatory Visit (HOSPITAL_COMMUNITY): Payer: Self-pay

## 2024-01-23 ENCOUNTER — Other Ambulatory Visit (HOSPITAL_COMMUNITY): Payer: Self-pay

## 2024-01-27 ENCOUNTER — Other Ambulatory Visit (HOSPITAL_COMMUNITY): Payer: Self-pay

## 2024-02-05 ENCOUNTER — Ambulatory Visit (INDEPENDENT_AMBULATORY_CARE_PROVIDER_SITE_OTHER): Payer: Medicaid Other | Admitting: Podiatry

## 2024-02-05 ENCOUNTER — Encounter: Payer: Self-pay | Admitting: Podiatry

## 2024-02-05 VITALS — Ht 64.0 in | Wt 230.0 lb

## 2024-02-05 DIAGNOSIS — D2371 Other benign neoplasm of skin of right lower limb, including hip: Secondary | ICD-10-CM | POA: Diagnosis not present

## 2024-02-05 DIAGNOSIS — B351 Tinea unguium: Secondary | ICD-10-CM | POA: Diagnosis not present

## 2024-02-05 NOTE — Patient Instructions (Signed)
 Look for urea 40% cream or ointment and apply to the thickened dry skin / calluses. This can be bought over the counter, at a pharmacy or online such as [employer].  Can also look for a combination product with salicylic acid Some over-the-counter less concentrated urea creams include Lac-Hydrin and AmLactin  We can also scrub the affected areas with white vinegar before showering.  This will make it easier for you to file the lesions down using a pumice stone or ped egg, or emery board device  More silicone and felt pads can be purchased from:  [email or web address]

## 2024-02-05 NOTE — Progress Notes (Signed)
Chief Complaint  Patient presents with   Callouses    He is here for a callous on the right foot and has  4 places and a form of athletes foot and done some of the patches and broke the skin and did not look infected per the pcp He would like the big toe on right foot checked for fungus.     HPI: 48 y.o. male presents today for above complaint.  Reports painful skin lesions on both feet that are painful with weightbearing.  He is also concerned about changes to his right first toenail.  His PCP recently saw him for athlete's foot.  He states that he is prediabetic.  Past Medical History:  Diagnosis Date   Essential hypertension     Past Surgical History:  Procedure Laterality Date   NO PAST SURGERIES      Allergies  Allergen Reactions   Prednisone Other (See Comments)    Hypes him up    ROS    Physical Exam: There were no vitals filed for this visit.  General: The patient is alert and oriented x3 in no acute distress.  Dermatology: Skin is warm, dry and supple bilateral lower extremities. Interspaces are clear of maceration and debris.  Right foot central arch and into the forefoot painful hyperkeratotic skin lesions x 4.  Tender on direct palpation.  Nucleated core.  Right first toenail is thickened, elongated, dystrophic with subungual debris and yellow discoloration.  Vascular: Palpable pedal pulses bilaterally. Capillary refill within normal limits.  No appreciable edema.  No erythema or calor.  Neurological: Light touch sensation grossly intact bilateral feet.   Musculoskeletal Exam: No pedal deformities noted  Radiographic Exam:  Deferred  Assessment/Plan of Care: 1. Benign neoplasm of skin of right foot   2. Onychomycosis      No orders of the defined types were placed in this encounter.  None  Discussed clinical findings with patient today.  # Benign skin neoplasm of right foot central arch and forefoot x 4. Discussed etiology and treatment  of benign skin neoplasm lesions in detail with the patient as well as multiple treatment options including blistering agents, chemotherapeutic agents, surgical excision, laser therapy and the indications and roles of the above.  Today recommended excisional debridement of the lesions with chemical destruction  Procedure: Destruction of Lesion Location: Right foot central arch and forefoot x 4 Instrumentation: 15 blade. Technique: Debridement of lesion to petechial bleeding. Aperture pad applied around lesion. Small amount of Salinocaine cream applied to the base of the lesion. Dressing: Dry, sterile, compression dressing. Disposition: Patient tolerated procedure well. Advised to leave dressing on for 6-8 hours. Thereafter patient to wash the area with soap and water and applied band-aid. Off-loading pads dispensed.  # Onychomycosis right first toenail -Nail specimen obtained and sent for pathology -Discussed potential treatment with oral medication, topical application, removal of the affected toenail.  Patient will follow-up in approximately 1 month to review nail pathology and decide on treatment.  Happy Begeman L. Lamount MAUL, AACFAS [facility]     [address] [place], [place] [address]                Office [phone]  Fax [phone])  [phone]   

## 2024-02-16 ENCOUNTER — Other Ambulatory Visit: Payer: Self-pay | Admitting: Podiatry

## 2024-02-24 ENCOUNTER — Other Ambulatory Visit (HOSPITAL_COMMUNITY): Payer: Self-pay

## 2024-02-25 ENCOUNTER — Other Ambulatory Visit (HOSPITAL_COMMUNITY): Payer: Self-pay

## 2024-02-26 ENCOUNTER — Other Ambulatory Visit (HOSPITAL_COMMUNITY): Payer: Self-pay

## 2024-02-26 MED ORDER — SEMAGLUTIDE-WEIGHT MANAGEMENT 0.25 MG/0.5ML ~~LOC~~ SOAJ
0.2500 mg | SUBCUTANEOUS | 0 refills | Status: AC
  Filled 2024-02-26 – 2025-01-27 (×2): qty 2, 28d supply, fill #0

## 2024-03-04 ENCOUNTER — Ambulatory Visit: Payer: Medicaid Other | Admitting: Podiatry

## 2024-03-04 ENCOUNTER — Encounter: Payer: Self-pay | Admitting: Podiatry

## 2024-03-04 DIAGNOSIS — D492 Neoplasm of unspecified behavior of bone, soft tissue, and skin: Secondary | ICD-10-CM

## 2024-03-04 DIAGNOSIS — B351 Tinea unguium: Secondary | ICD-10-CM

## 2024-03-04 DIAGNOSIS — D2371 Other benign neoplasm of skin of right lower limb, including hip: Secondary | ICD-10-CM

## 2024-03-04 MED ORDER — TERBINAFINE HCL 250 MG PO TABS: 250.0000 mg | ORAL_TABLET | Freq: Every day | ORAL | 0 refills | Status: DC

## 2024-03-04 NOTE — Progress Notes (Signed)
 Chief Complaint  Patient presents with   Follow-up    Patient states that his right foot is still the same as the last visit and he has been treating it his self. Still a little pain patient states    HPI: 48 y.o. male presents today for above complaint.  Here to review nail pathology.  Reports improvement in pain and appearance right foot plantar skin lesions.  He states that he is prediabetic.  Past Medical History:  Diagnosis Date   Essential hypertension     Past Surgical History:  Procedure Laterality Date   NO PAST SURGERIES      Allergies  Allergen Reactions   Prednisone Other (See Comments)    "Hypes him up"    ROS    Physical Exam: There were no vitals filed for this visit.  General: The patient is alert and oriented x3 in no acute distress.  Dermatology: Skin is warm, dry and supple bilateral lower extremities. Interspaces are clear of maceration and debris.  Skin lesions right foot appear improved from previous, there is 1 main lesion plantar arch right foot and 1 other satellite lesion notable.  Tender on direct palpation.  Thrombosed capillary central aspect plantar arch.  Right first toenail is thickened, elongated, dystrophic with subungual debris and yellow discoloration.  Vascular: Palpable pedal pulses bilaterally. Capillary refill within normal limits.  No appreciable edema.  No erythema or calor.  Neurological: Light touch sensation grossly intact bilateral feet.   Musculoskeletal Exam: No pedal deformities noted  Radiographic Exam:  Deferred  Assessment/Plan of Care: 1. Onychomycosis   2. Skin neoplasm      Meds ordered this encounter  Medications   terbinafine (LAMISIL) 250 MG tablet    Sig: Take 1 tablet (250 mg total) by mouth daily.    Dispense:  90 tablet    Refill:  0   None  Discussed clinical findings with patient today.  # Benign skin neoplasm of right foot central arch  x 2. Discussed etiology and treatment of benign  skin neoplasm lesions in detail with the patient as well as multiple treatment options including blistering agents, chemotherapeutic agents, surgical excision, laser therapy and the indications and roles of the above.  Today recommended excisional debridement of the lesions with chemical destruction with use of Cantharone application. -Follow-up in 3 weeks for this  Procedure: Destruction of Lesion Location: Right foot central arch and forefoot x 2 Instrumentation: 15 blade. Technique: Debridement of lesion to petechial bleeding. Aperture pad applied around lesion. Small amount of Salinocaine cream applied to the base of the lesion. Dressing: Dry, sterile, compression dressing. Disposition: Patient tolerated procedure well. Advised to leave dressing on for 6-8 hours. Thereafter patient to wash the area with soap and water and applied band-aid. Off-loading pads dispensed.  # Onychomycosis right first toenail -Nail pathology reviewed with patient.  Positive for fungal growth. -Treatment options discussed with patient.  Electing to proceed with oral medication.  No known liver problems.  Obtaining CBC and hepatic function panel.  Starting patient on oral terbinafine once a day.  Will notify patient with any pertinent abnormal lab values.  Blossie Raffel L. Marchia Bond, AACFAS [facility]     [address]  [place], [place] [address]                Office [phone]  Fax [phone]

## 2024-03-04 NOTE — Patient Instructions (Signed)
 Take dressing off in 8 hours and wash the foot with soap and water. If it is hurting or becomes uncomfortable before the 8 hours, go ahead and remove the bandage and wash the area.  If it blisters, apply antibiotic ointment and a band-aid.  Monitor for any signs/symptoms of infection. Call the office immediately if any occur or go directly to the emergency room. Call with any questions/concerns.

## 2024-03-05 ENCOUNTER — Telehealth: Payer: Self-pay | Admitting: Podiatry

## 2024-03-05 LAB — CBC
Hematocrit: 48.5 % (ref 37.5–51.0)
Hemoglobin: 16.6 g/dL (ref 13.0–17.7)
MCH: 31.7 pg (ref 26.6–33.0)
MCHC: 34.2 g/dL (ref 31.5–35.7)
MCV: 93 fL (ref 79–97)
Platelets: 373 10*3/uL (ref 150–450)
RBC: 5.24 x10E6/uL (ref 4.14–5.80)
RDW: 13.1 % (ref 11.6–15.4)
WBC: 9.3 10*3/uL (ref 3.4–10.8)

## 2024-03-05 LAB — HEPATIC FUNCTION PANEL
ALT: 40 IU/L (ref 0–44)
AST: 33 IU/L (ref 0–40)
Albumin: 4.5 g/dL (ref 4.1–5.1)
Alkaline Phosphatase: 101 IU/L (ref 44–121)
Bilirubin Total: 0.5 mg/dL (ref 0.0–1.2)
Bilirubin, Direct: 0.15 mg/dL (ref 0.00–0.40)
Total Protein: 7 g/dL (ref 6.0–8.5)

## 2024-03-05 NOTE — Telephone Encounter (Signed)
 Called and left a voice message to get appointment rescheduled.

## 2024-03-22 ENCOUNTER — Other Ambulatory Visit (HOSPITAL_COMMUNITY): Payer: Self-pay

## 2024-03-24 ENCOUNTER — Encounter (HOSPITAL_COMMUNITY): Payer: Self-pay

## 2024-03-25 ENCOUNTER — Other Ambulatory Visit (HOSPITAL_COMMUNITY): Payer: Self-pay

## 2024-03-25 ENCOUNTER — Ambulatory Visit: Admitting: Podiatry

## 2024-03-25 MED ORDER — WEGOVY 0.5 MG/0.5ML ~~LOC~~ SOAJ
0.5000 mg | SUBCUTANEOUS | 0 refills | Status: DC
Start: 2024-03-25 — End: 2024-04-19
  Filled 2024-03-25: qty 2, 28d supply, fill #0

## 2024-04-01 ENCOUNTER — Encounter: Payer: Self-pay | Admitting: Podiatry

## 2024-04-01 ENCOUNTER — Ambulatory Visit (INDEPENDENT_AMBULATORY_CARE_PROVIDER_SITE_OTHER): Admitting: Podiatry

## 2024-04-01 VITALS — Ht 64.0 in | Wt 230.0 lb

## 2024-04-01 DIAGNOSIS — D492 Neoplasm of unspecified behavior of bone, soft tissue, and skin: Secondary | ICD-10-CM | POA: Diagnosis not present

## 2024-04-01 DIAGNOSIS — B351 Tinea unguium: Secondary | ICD-10-CM

## 2024-04-01 MED ORDER — FLUOROURACIL 5 % EX CREA: TOPICAL_CREAM | Freq: Two times a day (BID) | CUTANEOUS | 0 refills | Status: AC

## 2024-04-01 NOTE — Progress Notes (Signed)
 Chief Complaint  Patient presents with   Nail Problem    " I think the medication for the nail is doing well but not sure about the wart on my foot if its getting better or not"     HPI: 48 y.o. male presents today for above complaint.  Here for follow-up of right foot skin lesion.  Lesion is still present, she has been offloading it so it has not been causing him as much pain.  He is also been on the oral terbinafine treatment for his nails for past month or so and feels that it is doing well.  Past Medical History:  Diagnosis Date   Essential hypertension     Past Surgical History:  Procedure Laterality Date   NO PAST SURGERIES      Allergies  Allergen Reactions   Prednisone Other (See Comments)    "Hypes him up"    ROS    Physical Exam: There were no vitals filed for this visit.  General: The patient is alert and oriented x3 in no acute distress.  Dermatology: Skin is warm, dry and supple bilateral lower extremities. Interspaces are clear of maceration and debris.  Right plantar forefoot lesion with mild hyperkeratotic buildup, there is thrombosed capillary with petechial bleeding upon debridement.  There are 2 other smaller hyperkeratotic lesions 1 is just proximal and central to the main lesion and the other is located and medial heel.  Hyperkeratotic corn noted.  Of note there is about 30 to 40% clearance of the right hallux first toenail proximally.  Vascular: Palpable pedal pulses bilaterally. Capillary refill within normal limits.  No appreciable edema.  No erythema or calor.  Neurological: Light touch sensation grossly intact bilateral feet.   Musculoskeletal Exam: No pedal deformities noted  Radiographic Exam:  Deferred  Assessment/Plan of Care: 1. Skin neoplasm   2. Onychomycosis      Meds ordered this encounter  Medications   fluorouracil (EFUDEX) 5 % cream    Sig: Apply topically 2 (two) times daily.    Dispense:  40 g    Refill:  0    None  Discussed clinical findings with patient today.  # Skin neoplasm lesions x 3, plantar forefoot, central midfoot and plantar medial heel is good Discussed etiology and treatment of benign skin neoplasm lesions in detail with the patient as well as multiple treatment options including blistering agents, chemotherapeutic agents, surgical excision, laser therapy and the indications and roles of the above.  Today recommended excisional debridement of the lesions with chemical destruction with use of Cantharone application. -Follow-up in 4 weeks for this -Prescribing Efudex cream be applied to the forefoot lesion twice a day, monitor for any signs of blistering following the Cantharone application and begin applying the Efudex once this resolves.  Procedure: Destruction of Lesion Location: Right foot central arch and forefoot x 3 Instrumentation: 312 blade. Technique: Debridement of lesion to petechial bleeding. Aperture pad applied around lesion.  Cantharone applied to forefoot lesion.  Salinocaine cream applied to the midfoot and heel lesion Dressing: Dry, sterile, compression dressing. Disposition: Patient tolerated procedure well. Advised to leave dressing on for 6-8 hours. Thereafter patient to wash the area with soap and water and applied band-aid. Off-loading pads dispensed.  # Onychomycosis right first toenail -There is approximately 30 to 40% clearing of the proximal nail plate. -Reviewed pertinent labs with patient having normal CBC, overall normal LFTs, ALT was on the higher end of normal we  will monitor this. -Patient denies any adverse reactions with the terbinafine.  We will continue this going forward, recheck labs in about 2 months. -I certify that this diagnosis represents a distinct and separate diagnosis that requires evaluation and treatment separate from other procedures or diagnosis   Lindzee Gouge L. Marchia Bond, AACFAS [facility]     [address] [place], [place] [address]                Office [phone]  Fax [phone]

## 2024-04-01 NOTE — Patient Instructions (Signed)
 Take dressing off in 8 hours and wash the foot with soap and water. If it is hurting or becomes uncomfortable before the 8 hours, go ahead and remove the bandage and wash the area.  If it blisters, apply antibiotic ointment and a band-aid.  Monitor for any signs/symptoms of infection. Call the office immediately if any occur or go directly to the emergency room. Call with any questions/concerns.

## 2024-04-19 ENCOUNTER — Other Ambulatory Visit (HOSPITAL_COMMUNITY): Payer: Self-pay

## 2024-04-19 MED ORDER — SEMAGLUTIDE-WEIGHT MANAGEMENT 0.5 MG/0.5ML ~~LOC~~ SOAJ
0.5000 mg | SUBCUTANEOUS | 0 refills | Status: DC
  Filled 2024-04-19: qty 2, 28d supply, fill #0

## 2024-04-21 ENCOUNTER — Other Ambulatory Visit (HOSPITAL_COMMUNITY): Payer: Self-pay

## 2024-04-21 MED ORDER — WEGOVY 1 MG/0.5ML ~~LOC~~ SOAJ
1.0000 mg | SUBCUTANEOUS | 0 refills | Status: AC
Start: 2024-04-21 — End: ?
  Filled 2024-04-21 – 2024-05-13 (×2): qty 2, 28d supply, fill #0

## 2024-04-22 ENCOUNTER — Other Ambulatory Visit (HOSPITAL_COMMUNITY): Payer: Self-pay

## 2024-04-29 ENCOUNTER — Encounter: Payer: Self-pay | Admitting: Podiatry

## 2024-04-29 ENCOUNTER — Ambulatory Visit (INDEPENDENT_AMBULATORY_CARE_PROVIDER_SITE_OTHER): Admitting: Podiatry

## 2024-04-29 DIAGNOSIS — D2371 Other benign neoplasm of skin of right lower limb, including hip: Secondary | ICD-10-CM

## 2024-04-29 DIAGNOSIS — D492 Neoplasm of unspecified behavior of bone, soft tissue, and skin: Secondary | ICD-10-CM | POA: Diagnosis not present

## 2024-04-29 DIAGNOSIS — B351 Tinea unguium: Secondary | ICD-10-CM

## 2024-04-29 NOTE — Patient Instructions (Signed)
 Take dressing off in 8 hours and wash the foot with soap and water. If it is hurting or becomes uncomfortable before the 8 hours, go ahead and remove the bandage and wash the area.  If it blisters, apply antibiotic ointment and a band-aid.  Monitor for any signs/symptoms of infection. Call the office immediately if any occur or go directly to the emergency room. Call with any questions/concerns.

## 2024-04-29 NOTE — Progress Notes (Signed)
 Chief Complaint  Patient presents with   Plantar Warts    Wart right foot plantar/ pt reports no pain/ 90% improvement    HPI: 48 y.o. male presents today for above complaint.  Here for follow-up of right foot skin lesion to the midfoot and lesions x2 of the heel. Patient reports improvement and notices minimal pain at this point. Of note has been on oral terbinafine  for the past two months and reports good progression to the nails at this point.  Past Medical History:  Diagnosis Date   Essential hypertension     Past Surgical History:  Procedure Laterality Date   NO PAST SURGERIES      Allergies  Allergen Reactions   Prednisone Other (See Comments)    "Hypes him up"    ROS    Physical Exam: There were no vitals filed for this visit.  General: The patient is alert and oriented x3 in no acute distress.  Dermatology: Skin is warm, dry and supple bilateral lower extremities. Interspaces are clear of maceration and debris.  Right plantar forefoot lesion with mild hyperkeratotic buildup, there is thrombosed capillary with petechial bleeding upon debridement.  There are 2 other smaller hyperkeratotic lesions 1 is just proximal and central to the main lesion and the other is located and medial heel with similar appearance.  Of note there is about 50% clearance of the right hallux first toenail proximally.  Vascular: Palpable pedal pulses bilaterally. Capillary refill within normal limits.  No appreciable edema.  No erythema or calor.  Neurological: Light touch sensation grossly intact bilateral feet.   Musculoskeletal Exam: No pedal deformities noted  Radiographic Exam:  Deferred  Assessment/Plan of Care: 1. Benign neoplasm of skin of right foot   2. Onychomycosis      No orders of the defined types were placed in this encounter.  None  Discussed clinical findings with patient today.  # Skin neoplasm lesions x 3, plantar forefoot, central midfoot and plantar  medial heel is good Discussed etiology and treatment of benign skin neoplasm lesions in detail with the patient as well as multiple treatment options including blistering agents, chemotherapeutic agents, surgical excision, laser therapy and the indications and roles of the above.  Today recommended excisional debridement of the lesions with chemical destruction with use of Cantharone application. -Follow-up in 4 weeks for this -Prescribing Efudex  cream be applied to the forefoot lesion twice a day, monitor for any signs of blistering following the Cantharone application and begin applying the Efudex  once this resolves.  Procedure: Destruction of Lesion Location: Right foot central arch and forefoot x 3 Instrumentation: 312 blade. Technique: Debridement of lesion to petechial bleeding. Aperture pad applied around lesion.  Cantharone applied to forefoot lesion.  Salinocaine cream applied to the midfoot and heel lesion Dressing: Dry, sterile, compression dressing. Disposition: Patient tolerated procedure well. Advised to leave dressing on for 6-8 hours. Thereafter patient to wash the area with soap and water and applied band-aid. Off-loading pads dispensed.  # Onychomycosis right first toenail -There is approximately 50% clearing of the proximal nail plate. -Recheck labs in 1 month at follow up. -Continue Terbinafine .   Tajai Suder L. Lunda Salines, AACFAS [facility]     [address]  [place], [place] [address]                Office [phone]  Fax [phone]

## 2024-04-30 ENCOUNTER — Encounter: Payer: Self-pay | Admitting: Podiatry

## 2024-05-14 ENCOUNTER — Other Ambulatory Visit (HOSPITAL_COMMUNITY): Payer: Self-pay

## 2024-05-17 ENCOUNTER — Other Ambulatory Visit (HOSPITAL_COMMUNITY): Payer: Self-pay

## 2024-05-27 ENCOUNTER — Ambulatory Visit: Admitting: Podiatry

## 2024-06-02 ENCOUNTER — Other Ambulatory Visit: Payer: Self-pay | Admitting: Podiatry

## 2024-06-02 DIAGNOSIS — B351 Tinea unguium: Secondary | ICD-10-CM

## 2024-06-10 ENCOUNTER — Ambulatory Visit: Admitting: Podiatry

## 2024-06-14 ENCOUNTER — Other Ambulatory Visit (HOSPITAL_COMMUNITY): Payer: Self-pay

## 2024-06-15 ENCOUNTER — Other Ambulatory Visit (HOSPITAL_COMMUNITY): Payer: Self-pay

## 2024-06-15 MED ORDER — SEMAGLUTIDE-WEIGHT MANAGEMENT 1 MG/0.5ML ~~LOC~~ SOAJ
1.0000 mg | SUBCUTANEOUS | 0 refills | Status: DC
  Filled 2024-06-15: qty 2, 28d supply, fill #0

## 2024-06-18 ENCOUNTER — Ambulatory Visit (INDEPENDENT_AMBULATORY_CARE_PROVIDER_SITE_OTHER): Admitting: Podiatry

## 2024-06-18 DIAGNOSIS — D492 Neoplasm of unspecified behavior of bone, soft tissue, and skin: Secondary | ICD-10-CM

## 2024-06-18 DIAGNOSIS — D2371 Other benign neoplasm of skin of right lower limb, including hip: Secondary | ICD-10-CM

## 2024-06-18 DIAGNOSIS — B351 Tinea unguium: Secondary | ICD-10-CM | POA: Diagnosis not present

## 2024-06-18 NOTE — Progress Notes (Unsigned)
 Wart resolved. Right hallux nail changes limited to less than 20% of the distal nail this point.  Has completed the terbinafine , still doing topical treatment.  Can continue topical treatments going forward until resolution F/up PRN or 6 months for diabetic foot exam

## 2024-06-21 ENCOUNTER — Encounter: Payer: Self-pay | Admitting: Podiatry

## 2024-07-18 ENCOUNTER — Other Ambulatory Visit (HOSPITAL_COMMUNITY): Payer: Self-pay

## 2024-07-19 ENCOUNTER — Other Ambulatory Visit (HOSPITAL_COMMUNITY): Payer: Self-pay

## 2024-07-19 ENCOUNTER — Encounter (HOSPITAL_COMMUNITY): Payer: Self-pay

## 2024-07-19 MED ORDER — WEGOVY 1 MG/0.5ML ~~LOC~~ SOAJ
1.0000 mg | SUBCUTANEOUS | 0 refills | Status: AC
  Filled 2024-07-19 (×2): qty 2, 28d supply, fill #0

## 2024-08-17 ENCOUNTER — Other Ambulatory Visit (HOSPITAL_COMMUNITY): Payer: Self-pay

## 2024-08-17 MED ORDER — WEGOVY 1.7 MG/0.75ML ~~LOC~~ SOAJ
1.7000 mg | SUBCUTANEOUS | 0 refills | Status: DC
  Filled 2024-08-17: qty 3, 28d supply, fill #0

## 2024-09-06 ENCOUNTER — Other Ambulatory Visit (HOSPITAL_BASED_OUTPATIENT_CLINIC_OR_DEPARTMENT_OTHER): Payer: Self-pay

## 2024-09-06 MED ORDER — BENZONATATE 100 MG PO CAPS
ORAL_CAPSULE | ORAL | 0 refills | Status: AC
  Filled 2024-09-06: qty 30, 10d supply, fill #0

## 2024-09-06 MED ORDER — GUAIFENESIN-CODEINE 100-10 MG/5ML PO SOLN
ORAL | 0 refills | Status: AC
  Filled 2024-09-06: qty 120, 4d supply, fill #0

## 2024-09-21 ENCOUNTER — Other Ambulatory Visit (HOSPITAL_COMMUNITY): Payer: Self-pay

## 2024-09-23 ENCOUNTER — Other Ambulatory Visit (HOSPITAL_COMMUNITY): Payer: Self-pay

## 2024-09-23 MED ORDER — WEGOVY 1.7 MG/0.75ML ~~LOC~~ SOAJ
1.7000 mg | SUBCUTANEOUS | 0 refills | Status: AC
  Filled 2024-09-23: qty 3, 28d supply, fill #0

## 2024-09-29 ENCOUNTER — Other Ambulatory Visit (HOSPITAL_BASED_OUTPATIENT_CLINIC_OR_DEPARTMENT_OTHER): Payer: Self-pay

## 2024-09-29 MED ORDER — WEGOVY 2.4 MG/0.75ML ~~LOC~~ SOAJ
2.4000 mg | SUBCUTANEOUS | 0 refills | Status: AC
  Filled 2024-09-29 – 2024-10-13 (×2): qty 3, 28d supply, fill #0

## 2024-10-04 ENCOUNTER — Encounter (HOSPITAL_BASED_OUTPATIENT_CLINIC_OR_DEPARTMENT_OTHER): Payer: Self-pay

## 2024-10-04 ENCOUNTER — Other Ambulatory Visit (HOSPITAL_BASED_OUTPATIENT_CLINIC_OR_DEPARTMENT_OTHER): Payer: Self-pay

## 2024-10-13 ENCOUNTER — Other Ambulatory Visit (HOSPITAL_BASED_OUTPATIENT_CLINIC_OR_DEPARTMENT_OTHER): Payer: Self-pay

## 2024-12-16 ENCOUNTER — Ambulatory Visit: Admitting: Podiatry

## 2024-12-16 ENCOUNTER — Encounter: Payer: Self-pay | Admitting: Podiatry

## 2024-12-16 DIAGNOSIS — B351 Tinea unguium: Secondary | ICD-10-CM | POA: Diagnosis not present

## 2024-12-16 NOTE — Progress Notes (Signed)
°   ° ° °  Chief Complaint  Patient presents with   [facility]    Follow up Diabetic foot exam. Feet are feeling good has not had any reoccurrence of callus area.  Diabetic unknown A1c. 81 mg Asprin.    HPI: 48 y.o. male presents today coming in for recheck of onychomycosis.  He has history of benign skin neoplasm which were treated with chemical destruction previously.  He has not noticed recurrence of either of these issues.  He has previously completed course of oral terbinafine .  He reports prediabetes.  Does take Wegovy .  Past Medical History:  Diagnosis Date   Essential hypertension     Past Surgical History:  Procedure Laterality Date   NO PAST SURGERIES      Allergies[1]  ROS    Physical Exam: There were no vitals filed for this visit.  General: The patient is alert and oriented x3 in no acute distress.  Dermatology: Skin is warm, dry and supple bilateral lower extremities. Interspaces are clear of maceration and debris.  Nailplates within normal limits.  No recurrence of skin neoplasm lesions.  Vascular: Palpable pedal pulses bilaterally. Capillary refill within normal limits.  No appreciable edema.  No erythema or calor.  Neurological: Light touch sensation grossly intact bilateral feet.   Musculoskeletal Exam: No pedal deformities noted    Assessment/Plan of Care: 1. Onychomycosis      No orders of the defined types were placed in this encounter.  None  Discussed clinical findings with patient today.  Overall good clinical cure of onychomycosis.  No recurrence of the benign skin neoplasm lesions to the right foot.  He is prediabetic.  Did discuss importance of tight glucose control and daily foot monitoring.  Can follow-up as needed at this point going forward.   Brittanni Cariker L. Lamount MAUL, AACFAS [facility]     [address] [place], [place] [address]                Office [phone]  Fax [phone]        [1]  Allergies Allergen Reactions   Prednisone Other (See Comments)    Hypes him up

## 2024-12-17 ENCOUNTER — Ambulatory Visit: Admitting: Podiatry

## 2025-01-19 ENCOUNTER — Other Ambulatory Visit (HOSPITAL_COMMUNITY): Payer: Self-pay

## 2025-01-19 ENCOUNTER — Encounter (HOSPITAL_COMMUNITY): Payer: Self-pay

## 2025-01-19 MED ORDER — SEMAGLUTIDE-WEIGHT MANAGEMENT 0.5 MG/0.5ML ~~LOC~~ SOAJ
0.5000 mg | SUBCUTANEOUS | 0 refills | Status: AC
  Filled 2025-01-19 – 2025-01-27 (×2): qty 2, 28d supply, fill #0

## 2025-01-27 ENCOUNTER — Other Ambulatory Visit (HOSPITAL_COMMUNITY): Payer: Self-pay
